# Patient Record
Sex: Male | Born: 1950 | Race: White | Hispanic: No | Marital: Married | Smoking: Never smoker
Health system: Southern US, Community
[De-identification: ages and names within clinical notes are randomized; demographics above are authoritative.]

---

## 2018-02-21 ENCOUNTER — Ambulatory Visit (INDEPENDENT_AMBULATORY_CARE_PROVIDER_SITE_OTHER): Payer: Medicare Other | Admitting: Podiatry

## 2018-02-21 ENCOUNTER — Encounter: Payer: Self-pay | Admitting: Podiatry

## 2018-02-21 ENCOUNTER — Ambulatory Visit (INDEPENDENT_AMBULATORY_CARE_PROVIDER_SITE_OTHER): Payer: Federal, State, Local not specified - PPO

## 2018-02-21 DIAGNOSIS — M7741 Metatarsalgia, right foot: Secondary | ICD-10-CM | POA: Diagnosis not present

## 2018-02-21 DIAGNOSIS — M79672 Pain in left foot: Secondary | ICD-10-CM

## 2018-02-21 DIAGNOSIS — M79671 Pain in right foot: Secondary | ICD-10-CM

## 2018-02-21 DIAGNOSIS — D3610 Benign neoplasm of peripheral nerves and autonomic nervous system, unspecified: Secondary | ICD-10-CM

## 2018-02-21 DIAGNOSIS — M7742 Metatarsalgia, left foot: Secondary | ICD-10-CM

## 2018-02-21 DIAGNOSIS — M778 Other enthesopathies, not elsewhere classified: Secondary | ICD-10-CM

## 2018-02-21 DIAGNOSIS — M216X2 Other acquired deformities of left foot: Secondary | ICD-10-CM

## 2018-02-21 DIAGNOSIS — R52 Pain, unspecified: Secondary | ICD-10-CM

## 2018-02-21 DIAGNOSIS — M216X1 Other acquired deformities of right foot: Secondary | ICD-10-CM | POA: Diagnosis not present

## 2018-02-21 DIAGNOSIS — M779 Enthesopathy, unspecified: Secondary | ICD-10-CM

## 2018-02-21 DIAGNOSIS — M7751 Other enthesopathy of right foot: Secondary | ICD-10-CM | POA: Diagnosis not present

## 2018-02-21 MED ORDER — MELOXICAM 15 MG PO TABS: 15.0000 mg | ORAL_TABLET | Freq: Every day | ORAL | 0 refills | Status: DC

## 2018-02-21 NOTE — Progress Notes (Signed)
This patient presents to the office with chief complaint of severe pain noted in the balls of both feet.  He says this is been going on for 3-4 years and by noon  he has significant pain which causes him to limp for the rest of the day. He points to the forefoot area as the site of his pain  .He denies any redness, swelling in both forefeet.  He has provided no self treatment nor sought any professional help.  He presents the office today for an evaluation and treatment.  General Appearance  Alert, conversant and in no acute stress.  Vascular  Dorsalis pedis and posterior tibial  pulses are palpable  bilaterally.  Capillary return is within normal limits  bilaterally. Temperature is within normal limits  bilaterally.  Neurologic  Senn-Weinstein monofilament wire test within normal limits  bilaterally. Muscle power within normal limits bilaterally.  Nails Normal nails noted with no evidence of bacterial or fungal infection.  Orthopedic  No limitations of motion of motion feet .  No crepitus or effusions noted.  Palpable pain noted to the second, third, fourth and fifth metatarsal heads both feet.  Pain noted in the third interspace bilaterally.  Excessive pronation noted upon weight-bearing.  Skin  normotropic skin with no porokeratosis noted bilaterally.  No signs of infections or ulcers noted.    Capsulitis forefeet  B/L  Excessive pronation  IE.  X-rays reveal midfoot arthritis.  No evidence of any bony pathology forefeet bilaterally.  Discussed this condition with this patient.  Told the patient that due to his severe pronation the transfer of his body. His weight through his forefoot is aggravated.  Prescribed power step insoles to be worn.  Prescribed Mobic to be taken by mouth.  RTC 2 weeks.   Gardiner Barefoot DPM

## 2018-03-07 ENCOUNTER — Encounter: Payer: Self-pay | Admitting: Podiatry

## 2018-03-07 ENCOUNTER — Ambulatory Visit (INDEPENDENT_AMBULATORY_CARE_PROVIDER_SITE_OTHER): Payer: Federal, State, Local not specified - PPO | Admitting: Podiatry

## 2018-03-07 DIAGNOSIS — M216X2 Other acquired deformities of left foot: Secondary | ICD-10-CM

## 2018-03-07 DIAGNOSIS — D3610 Benign neoplasm of peripheral nerves and autonomic nervous system, unspecified: Secondary | ICD-10-CM

## 2018-03-07 DIAGNOSIS — M216X1 Other acquired deformities of right foot: Secondary | ICD-10-CM

## 2018-03-07 NOTE — Progress Notes (Signed)
This patient presents the office for continued evaluation and treatment of this painful forefeet bilateral.  Patient states that he has been wearing his power step insoles and taking his Mobic by mouth.  He says he is 85% improved and is pleased with his progress.  He says there are still pain noted in the bottoms of toes on both feet.  No evidence of redness, swelling or inflammation noted on his feet.  He presents the today for his painful forefeet.  General Appearance  Alert, conversant and in no acute stress.  Vascular  Dorsalis pedis and posterior tibial  pulses are palpable  bilaterally.  Capillary return is within normal limits  bilaterally. Temperature is within normal limits  bilaterally.  Neurologic  Senn-Weinstein monofilament wire test within normal limits  bilaterally. Muscle power within normal limits bilaterally.  Nails Thick disfigured discolored nails with subungual debris  from hallux to fifth toes bilaterally. No evidence of bacterial infection or drainage bilaterally.  Orthopedic  No limitations of motion of motion feet .  No crepitus or effusions noted.  No bony pathology or digital deformities noted. Persistent palpable pain in the second and third interspace bilaterally.  Excessive pronation bilaterally.  Skin  normotropic skin with no porokeratosis noted bilaterally.  No signs of infections or ulcers noted.    Neuroma  B/L  Pronation  B/L  ROV.  Patient was told to continue taking Mobic and using his power step insoles.  He was told we can obtain  a permanent ortthoses for him as needed.  Take Mobic when necessary.  RTC prn.  Gardiner Barefoot DPM

## 2018-03-16 ENCOUNTER — Other Ambulatory Visit: Payer: Self-pay | Admitting: Podiatry

## 2018-03-16 DIAGNOSIS — D3610 Benign neoplasm of peripheral nerves and autonomic nervous system, unspecified: Secondary | ICD-10-CM

## 2021-08-18 ENCOUNTER — Ambulatory Visit
Admission: EM | Admit: 2021-08-18 | Discharge: 2021-08-18 | Disposition: A | Discharge status: Home / Self Care | Payer: Federal, State, Local not specified - PPO | Attending: Urgent Care | Admitting: Urgent Care

## 2021-08-18 ENCOUNTER — Telehealth: Payer: Self-pay

## 2021-08-18 ENCOUNTER — Other Ambulatory Visit: Payer: Self-pay

## 2021-08-18 DIAGNOSIS — M25512 Pain in left shoulder: Secondary | ICD-10-CM

## 2021-08-18 MED ORDER — MELOXICAM 7.5 MG PO TABS: 7.5000 mg | ORAL_TABLET | Freq: Every day | ORAL | 0 refills | Status: DC

## 2021-08-18 MED ORDER — TIZANIDINE HCL 4 MG PO TABS: 4.0000 mg | ORAL_TABLET | Freq: Every day | ORAL | 0 refills | Status: DC

## 2021-08-18 NOTE — ED Provider Notes (Signed)
[place]   MRN: [id number] DOB: 04-20-1951  Subjective:   Jason Espinoza is a 70 y.o. male presenting for 54-month history of persistent intermittent left shoulder pain.  He has also had decreased range of motion.  Reports that he actually slipped and fell 2 months ago and hurt his left shoulder then.  [place] again 2 weeks ago and this time his shoulder hurt much worse.  He did land on his elbow but has noticed that he has had some bruising coming from the shoulder down into the arm.  No particular swelling or pain at the shoulder.  He has a very difficult time at night when he lays down.  No current facility-administered medications for this encounter.  Current Outpatient Medications:    meloxicam (MOBIC) 15 MG tablet, Take 1 tablet (15 mg total) by mouth daily., Disp: 30 tablet, Rfl: 0   No Known Allergies  History reviewed. No pertinent past medical history.   History reviewed. No pertinent surgical history.  Family History  Problem Relation Age of Onset   Healthy Mother    Diabetes Father    Hypertension Father     Social History   Tobacco Use   Smoking status: Never   Smokeless tobacco: Never  Substance Use Topics   Alcohol use: Not Currently   Drug use: Never    ROS   Objective:   Vitals: BP 130/76    Pulse (!) 59    Temp 98 F (36.7 C)    Resp 19    SpO2 96%   Physical Exam Constitutional:      General: He is not in acute distress.    Appearance: Normal appearance. He is well-developed and normal weight. He is not ill-appearing, toxic-appearing or diaphoretic.  HENT:     Head: Normocephalic and atraumatic.     Right Ear: External ear normal.     Left Ear: External ear normal.     Nose: Nose normal.     Mouth/Throat:     Pharynx: Oropharynx is clear.  Eyes:     General: No scleral icterus.       Right eye: No discharge.        Left eye: No discharge.     Extraocular Movements: Extraocular movements intact.     Pupils: Pupils are  equal, round, and reactive to light.  Cardiovascular:     Rate and Rhythm: Normal rate.  Pulmonary:     Effort: Pulmonary effort is normal.  Musculoskeletal:     Left shoulder: No swelling, deformity, effusion, laceration, tenderness, bony tenderness or crepitus. Decreased range of motion (Abduction above 90 degrees, flexion at 90 degrees; however, there is full passive range of motion). Normal strength.       Arms:     Cervical back: Normal range of motion.  Neurological:     Mental Status: He is alert and oriented to person, place, and time.  Psychiatric:        Mood and Affect: Mood normal.        Behavior: Behavior normal.        Thought Content: Thought content normal.        Judgment: Judgment normal.    Assessment and Plan :   PDMP not reviewed this encounter.  1. Acute pain of left shoulder    Possible biceps tendon injury, rotator cuff injury, labral injury.  Physical exam findings not consistent with fracture, dislocation and therefore will defer imaging.  Recommended establishing care and following up  with an orthopedist to pursue imaging such as an MRI or ultrasound to evaluate for soft tissue injury.  In the meantime counseled on shoulder rehab exercises, recommended meloxicam once daily with tizanidine as needed. Counseled patient on potential for adverse effects with medications prescribed/recommended today, ER and return-to-clinic precautions discussed, patient verbalized understanding.    Jaynee Eagles, PA-C 08/18/21 1423

## 2021-08-18 NOTE — ED Triage Notes (Signed)
Pt presents with complaints of left shoulder pain. Reports tripping and falling this past week and landing on his left elbow. Reports 2 months ago he slipped on a piece of plastic and tried to grab something to keep him from falling hurting his left shoulder. Pt has had ongoing issues with shoulder since initial injury.

## 2021-08-18 NOTE — Telephone Encounter (Signed)
Spoke with pt and told to contact the pharmacy again, as prescription was resend.

## 2021-08-19 ENCOUNTER — Ambulatory Visit: Payer: Self-pay

## 2021-08-19 ENCOUNTER — Ambulatory Visit (HOSPITAL_BASED_OUTPATIENT_CLINIC_OR_DEPARTMENT_OTHER)
Admission: RE | Admit: 2021-08-19 | Discharge: 2021-08-19 | Disposition: A | Discharge status: Home / Self Care | Payer: Federal, State, Local not specified - PPO | Source: Ambulatory Visit | Attending: Family Medicine | Admitting: Family Medicine

## 2021-08-19 ENCOUNTER — Ambulatory Visit (INDEPENDENT_AMBULATORY_CARE_PROVIDER_SITE_OTHER): Payer: Federal, State, Local not specified - PPO | Admitting: Family Medicine

## 2021-08-19 ENCOUNTER — Encounter: Payer: Self-pay | Admitting: Family Medicine

## 2021-08-19 VITALS — BP 148/90 | Ht 72.0 in | Wt 185.0 lb

## 2021-08-19 DIAGNOSIS — S46812A Strain of other muscles, fascia and tendons at shoulder and upper arm level, left arm, initial encounter: Secondary | ICD-10-CM | POA: Insufficient documentation

## 2021-08-19 DIAGNOSIS — M25512 Pain in left shoulder: Secondary | ICD-10-CM

## 2021-08-19 NOTE — Progress Notes (Signed)
°  Jason Espinoza - 70 y.o. male MRN [id number]  Date of birth: [birth year]  SUBJECTIVE:  Including CC & ROS.  No chief complaint on file.   Jason Espinoza is a 69 y.o. male that is presenting with acute left shoulder pain.  The pain is ongoing for the past few weeks.  He had a injury where it forced his arm into abduction as well as external rotation.  Have significant bruising in the proximal portion of his upper arm.  Unable to lift and abduction against gravity.    Review of Systems See HPI   HISTORY: Past Medical, Surgical, Social, and Family History Reviewed & Updated per EMR.   Pertinent Historical Findings include:  History reviewed. No pertinent past medical history.  History reviewed. No pertinent surgical history.  Family History  Problem Relation Age of Onset   Healthy Mother    Diabetes Father    Hypertension Father     Social History   Socioeconomic History   Marital status: Married    Spouse name: Not on file   Number of children: Not on file   Years of education: Not on file   Highest education level: Not on file  Occupational History   Not on file  Tobacco Use   Smoking status: Never   Smokeless tobacco: Never  Substance and Sexual Activity   Alcohol use: Not Currently   Drug use: Never   Sexual activity: Not on file  Other Topics Concern   Not on file  Social History Narrative   Not on file   Social Determinants of Health   Financial Resource Strain: Not on file  Food Insecurity: Not on file  Transportation Needs: Not on file  Physical Activity: Not on file  Stress: Not on file  Social Connections: Not on file  Intimate Partner Violence: Not on file     PHYSICAL EXAM:  VS: BP (!) 148/90 (BP Location: Left Arm, Patient Position: Sitting)    Ht 6' (1.829 m)    Wt 185 lb (83.9 kg)    BMI 25.09 kg/m  Physical Exam Gen: NAD, alert, cooperative with exam, well-appearing   Limited ultrasound: Left shoulder:  Encircling effusion of the biceps  tendon. Partial complete tear of the subscapularis. Partial complete tear of the supraspinatus with overlying bursitis. Effusion appreciated within the posterior glenohumeral joint.  Summary: Findings indicate partial complete tears of the subscapularis and supraspinatus  Ultrasound and interpretation by Clearance Coots, MD     ASSESSMENT & PLAN:   Traumatic tear of supraspinatus tendon of left shoulder Injury occurred a few weeks ago.  Has ecchymosis on the upper extremity.  Has weakness with abduction and gravity. -Counseled on home exercise therapy and supportive care. -Counseled on sling. -X-ray. -MRI of the left shoulder to evaluate the extent of the rotator cuff tear and presurgical management..  Full thickness tear of left subscapularis tendon Injury occurred a few weeks ago.  Has weakness with external rotation. -Counseled on home exercise therapy and supportive care. -X-ray. -MRI of the left shoulder to evaluate for rotator cuff tear and presurgical management.

## 2021-08-19 NOTE — Assessment & Plan Note (Addendum)
Injury occurred a few weeks ago.  Has ecchymosis on the upper extremity.  Has weakness with abduction and gravity. -Counseled on home exercise therapy and supportive care. -Counseled on sling. -X-ray. -MRI of the left shoulder to evaluate the extent of the rotator cuff tear and presurgical management.Marland Kitchen

## 2021-08-19 NOTE — Patient Instructions (Signed)
Nice to meet you  Please use ice as needed  Please use the sling for comfort  Please call [phone] to schedule the MRI  Please send me a message in MyChart with any questions or updates.  We'll schedule a virtual visit once the MRI is resulted.   --Dr. Raeford Razor

## 2021-08-19 NOTE — Assessment & Plan Note (Signed)
Injury occurred a few weeks ago.  Has weakness with external rotation. -Counseled on home exercise therapy and supportive care. -X-ray. -MRI of the left shoulder to evaluate for rotator cuff tear and presurgical management.

## 2021-08-21 ENCOUNTER — Telehealth: Payer: Self-pay | Admitting: Family Medicine

## 2021-08-21 NOTE — Telephone Encounter (Signed)
Pt informed of below.  

## 2021-08-21 NOTE — Telephone Encounter (Signed)
Left VM for patient. If he calls back please have him speak with a nurse/CMA and inform that his xray is normal. We'll continue with the mri.   If any questions then please take the best time and phone number to call and I will try to call him back.   Rosemarie Ax, MD [facility] 08/21/2021, 9:40 AM

## 2021-09-09 ENCOUNTER — Telehealth: Payer: Self-pay | Admitting: Family Medicine

## 2021-09-09 NOTE — Telephone Encounter (Signed)
Pt called states MRI not till 09/18/21 @ GI, he about to run out of Rx given by ED provider & ask if Dr.Schmitz would approve refills on :   meloxicam (MOBIC) 7.5 MG tablet [[id number]]    Order Details Dose: 7.5 mg Route: Oral Frequency: Daily  Dispense Quantity: 30 tablet Refills: 0        Sig: Take 1 tablet (7.5 mg total) by mouth daily.       &   tiZANidine (ZANAFLEX) 4 MG tablet [[id number]]    Order Details Dose: 4 mg Route: Oral Frequency: Daily at bedtime  Dispense Quantity: 30 tablet Refills: 0        Sig: Take 1 tablet (4 mg total) by mouth at bedtime    --Pt uses :   [id number] - [place], [place] - [address] [facility] Phone:  [phone]  Fax:  [phone]    --Dion Body

## 2021-09-10 MED ORDER — MELOXICAM 7.5 MG PO TABS: 7.5000 mg | ORAL_TABLET | Freq: Every day | ORAL | 0 refills | Status: DC

## 2021-09-10 MED ORDER — TIZANIDINE HCL 4 MG PO TABS: 4.0000 mg | ORAL_TABLET | Freq: Every day | ORAL | 0 refills | Status: DC

## 2021-09-18 ENCOUNTER — Other Ambulatory Visit: Payer: Self-pay

## 2021-09-18 ENCOUNTER — Ambulatory Visit
Admission: RE | Admit: 2021-09-18 | Discharge: 2021-09-18 | Disposition: A | Discharge status: Home / Self Care | Payer: Federal, State, Local not specified - PPO | Source: Ambulatory Visit | Attending: Family Medicine | Admitting: Family Medicine

## 2021-09-18 DIAGNOSIS — S46812A Strain of other muscles, fascia and tendons at shoulder and upper arm level, left arm, initial encounter: Secondary | ICD-10-CM

## 2021-09-19 ENCOUNTER — Encounter: Payer: Self-pay | Admitting: Family Medicine

## 2021-09-19 ENCOUNTER — Telehealth (INDEPENDENT_AMBULATORY_CARE_PROVIDER_SITE_OTHER): Payer: Federal, State, Local not specified - PPO | Admitting: Family Medicine

## 2021-09-19 VITALS — Ht 72.0 in | Wt 185.0 lb

## 2021-09-19 DIAGNOSIS — S46812D Strain of other muscles, fascia and tendons at shoulder and upper arm level, left arm, subsequent encounter: Secondary | ICD-10-CM | POA: Diagnosis not present

## 2021-09-19 DIAGNOSIS — M19012 Primary osteoarthritis, left shoulder: Secondary | ICD-10-CM | POA: Diagnosis not present

## 2021-09-19 DIAGNOSIS — M19019 Primary osteoarthritis, unspecified shoulder: Secondary | ICD-10-CM | POA: Insufficient documentation

## 2021-09-19 MED ORDER — NITROGLYCERIN 0.2 MG/HR TD PT24: MEDICATED_PATCH | TRANSDERMAL | 11 refills | Status: DC

## 2021-09-19 NOTE — Assessment & Plan Note (Signed)
MRI was confirming the tear that shows retraction of 2.5 cm.He would like to stay away from surgery is much as possible. -Counseled on home exercise therapy and supportive care. -Referral to physical therapy. -Could consider injection.

## 2021-09-19 NOTE — Assessment & Plan Note (Signed)
MRI was confirming the tear of the supraspinatus with mild retraction.  It does occur at the interval. -Counseled at home exercise therapy and supportive care. -Referral to physical therapy. -Nitro patches. -Shockwave therapy.

## 2021-09-19 NOTE — Progress Notes (Signed)
Virtual Visit via Telephone Note  I connected with Jason Espinoza on 09/19/21 at 10:50 AM EST by telephone and verified that I am speaking with the correct person using two identifiers.  Location: Patient: home Provider: office   I discussed the limitations, risks, security and privacy concerns of performing an evaluation and management service by telephone and the availability of in person appointments. I also discussed with the patient that there may be a patient responsible charge related to this service. The patient expressed understanding and agreed to proceed.   History of Present Illness:  Jason Espinoza is a 71 yo M that is following up for the [facility] of his left shoulder.  This was revealing for a small retracted tear of the anterior aspect of the supraspinatus that is 10 mm in retraction and 14 mm wide.  There is a full-thickness retracted tear of the subscapularis at 2.5 cm.  The glenohumeral joint is showing moderate degenerative changes with effusion.   Observations/Objective:   Assessment and Plan:  Full-thickness tear of the left subscapularis tendon: MRI was confirming the tear that shows retraction of 2.5 cm.He would like to stay away from surgery is much as possible. -Counseled on home exercise therapy and supportive care. -Referral to physical therapy. -Could consider injection.  Traumatic tear of the supraspinatus tendon of the left shoulder: MRI was confirming the tear of the supraspinatus with mild retraction.  It does occur at the interval. -Counseled at home exercise therapy and supportive care. -Referral to physical therapy. -Nitro patches. -Shockwave therapy.  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  He has 2 tears of the rotator cuff currently.  He wants to avoid surgery as much as possible.  Counseled on the possibility of shoulder replacement.  We will pursue conservative measures at this time.  Follow Up  Instructions:    I discussed the assessment and treatment plan with the patient. The patient was provided an opportunity to ask questions and all were answered. The patient agreed with the plan and demonstrated an understanding of the instructions.   The patient was advised to call back or seek an in-person evaluation if the symptoms worsen or if the condition fails to improve as anticipated.  I provided 8 minutes of non-face-to-face time during this encounter.   Clearance Coots, MD

## 2021-09-19 NOTE — Assessment & Plan Note (Signed)
MRI was revealing for degenerative changes with an effusion.  He has 2 tears of the rotator cuff currently.  He wants to avoid surgery as much as possible.  Counseled on the possibility of shoulder replacement.  We will pursue conservative measures at this time.

## 2021-09-22 ENCOUNTER — Telehealth: Payer: Self-pay | Admitting: Family Medicine

## 2021-09-22 NOTE — Telephone Encounter (Signed)
[facility] rep cld to say Shoulder therapy Order needs to be changed to Occupational Therapy they don't do  shoulder rehab under  Physical therapy.  --Forwarding request to provider for changes.  --glh

## 2021-09-22 NOTE — Addendum Note (Signed)
Addended by: Cyd Silence on: 09/22/2021 10:51 AM   Modules accepted: Orders

## 2021-09-23 ENCOUNTER — Telehealth: Payer: Self-pay | Admitting: Family Medicine

## 2021-09-23 NOTE — Telephone Encounter (Signed)
Patient informed to place his nitro patch on the painful area of his left shoulder.

## 2021-09-23 NOTE — Telephone Encounter (Signed)
Pt called wishes someone to tell gim where to place the  Nitroglycerin patch on his body-- ---Forwarding message to med asst--[phone]

## 2021-09-24 ENCOUNTER — Ambulatory Visit (INDEPENDENT_AMBULATORY_CARE_PROVIDER_SITE_OTHER): Payer: Federal, State, Local not specified - PPO | Admitting: Family Medicine

## 2021-09-24 ENCOUNTER — Encounter: Payer: Self-pay | Admitting: Family Medicine

## 2021-09-24 DIAGNOSIS — S46812D Strain of other muscles, fascia and tendons at shoulder and upper arm level, left arm, subsequent encounter: Secondary | ICD-10-CM

## 2021-09-24 NOTE — Assessment & Plan Note (Signed)
Completed shockwave therapy  

## 2021-09-24 NOTE — Progress Notes (Signed)
°  Jason Espinoza - 71 y.o. male MRN [id number]  Date of birth: 27-Feb-1951  SUBJECTIVE:  Including CC & ROS.  No chief complaint on file.   Jason Espinoza is a 71 y.o. male that is here for shockwave therapy.    Review of Systems See HPI   HISTORY: Past Medical, Surgical, Social, and Family History Reviewed & Updated per EMR.   Pertinent Historical Findings include:  History reviewed. No pertinent past medical history.  History reviewed. No pertinent surgical history.   PHYSICAL EXAM:  VS: Ht 6' (1.829 m)    Wt 185 lb (83.9 kg)    BMI 25.09 kg/m  Physical Exam Gen: NAD, alert, cooperative with exam, well-appearing MSK:  Neurovascularly intact    ECSWT Note Jason Espinoza [birth year]  Procedure: ECSWT Indications: left shoulder pain   Procedure Details Consent: Risks of procedure as well as the alternatives and risks of each were explained to the (patient/caregiver).  Consent for procedure obtained. Time Out: Verified patient identification, verified procedure, site/side was marked, verified correct patient position, special equipment/implants available, medications/allergies/relevent history reviewed, required imaging and test results available.  Performed.  The area was cleaned with iodine and alcohol swabs.    The left shoulder was targeted for Extracorporeal shockwave therapy.   Preset: shoulder problems Power Level: 80 Frequency: 10 Impulse/cycles: 3200 Head size: medium  Session: 1st  Patient did tolerate procedure well.    ASSESSMENT & PLAN:   Traumatic tear of supraspinatus tendon of left shoulder Completed shockwave therapy

## 2021-09-24 NOTE — Patient Instructions (Signed)
Good to see you  Please send me a message in MyChart with any questions or updates.  Please see me back in 1 week.   --Dr. Raeford Razor  Nitroglycerin Protocol  Apply 1/4 nitroglycerin patch to affected area daily. Change position of patch within the affected area every 24 hours. You may experience a headache during the first 1-2 weeks of using the patch, these should subside. If you experience headaches after beginning nitroglycerin patch treatment, you may take your preferred over the counter pain reliever. Another side effect of the nitroglycerin patch is skin irritation or rash related to patch adhesive. Please notify our office if you develop more severe headaches or rash, and stop the patch. Tendon healing with nitroglycerin patch may require 12 to 24 weeks depending on the extent of injury. Men should not use if taking Viagra, Cialis, or Levitra.  Do not use if you have migraines or rosacea.

## 2021-10-01 ENCOUNTER — Encounter: Payer: Self-pay | Admitting: Family Medicine

## 2021-10-01 ENCOUNTER — Ambulatory Visit (INDEPENDENT_AMBULATORY_CARE_PROVIDER_SITE_OTHER): Payer: Self-pay | Admitting: Family Medicine

## 2021-10-01 DIAGNOSIS — S46812D Strain of other muscles, fascia and tendons at shoulder and upper arm level, left arm, subsequent encounter: Secondary | ICD-10-CM

## 2021-10-01 NOTE — Assessment & Plan Note (Signed)
Completed shockwave therapy today.  

## 2021-10-01 NOTE — Progress Notes (Signed)
°  Jason Espinoza - 71 y.o. male MRN [id number]  Date of birth: 1950-10-19  SUBJECTIVE:  Including CC & ROS.  No chief complaint on file.   Jason Espinoza is a 71 y.o. male that is presenting for shockwave therapy.    Review of Systems See HPI   HISTORY: Past Medical, Surgical, Social, and Family History Reviewed & Updated per EMR.   Pertinent Historical Findings include:  History reviewed. No pertinent past medical history.  History reviewed. No pertinent surgical history.   PHYSICAL EXAM:  VS: Ht 6' (1.829 m)    Wt 185 lb (83.9 kg)    BMI 25.09 kg/m  Physical Exam Gen: NAD, alert, cooperative with exam, well-appearing MSK:  Neurovascularly intact    ECSWT Note Quavon Keisling 1950/09/10  Procedure: ECSWT Indications: left shoulder pain   Procedure Details Consent: Risks of procedure as well as the alternatives and risks of each were explained to the (patient/caregiver).  Consent for procedure obtained. Time Out: Verified patient identification, verified procedure, site/side was marked, verified correct patient position, special equipment/implants available, medications/allergies/relevent history reviewed, required imaging and test results available.  Performed.  The area was cleaned with iodine and alcohol swabs.    The left shoulder was targeted for Extracorporeal shockwave therapy.   Preset: Shoulder problems Power Level: 80 Frequency: 10 Impulse/cycles: 3500 Head size: Medium Session: 2nd  Patient did tolerate procedure well.    ASSESSMENT & PLAN:   Traumatic tear of supraspinatus tendon of left shoulder Completed shockwave therapy today.

## 2021-10-02 ENCOUNTER — Other Ambulatory Visit: Payer: Self-pay

## 2021-10-02 ENCOUNTER — Encounter (HOSPITAL_COMMUNITY): Payer: Self-pay | Admitting: Occupational Therapy

## 2021-10-02 ENCOUNTER — Ambulatory Visit (HOSPITAL_COMMUNITY): Payer: Federal, State, Local not specified - PPO | Attending: Family Medicine | Admitting: Occupational Therapy

## 2021-10-02 DIAGNOSIS — R29898 Other symptoms and signs involving the musculoskeletal system: Secondary | ICD-10-CM | POA: Diagnosis present

## 2021-10-02 DIAGNOSIS — M25512 Pain in left shoulder: Secondary | ICD-10-CM | POA: Diagnosis not present

## 2021-10-02 NOTE — Therapy (Signed)
[place], [place], [address] Phone: [phone]   Fax:  [phone]  Occupational Therapy Evaluation  Patient Details  Name: Jason Espinoza MRN: [id number] Date of Birth: 1951/01/21 Referring Provider (OT): Dr. Clearance Coots   Encounter Date: 10/02/2021   OT End of Session - 10/02/21 1329     Visit Number 1    Number of Visits 1    Date for OT Re-Evaluation 10/03/21    Authorization Type [employer], $25 copay    Authorization Time Period 25 visit limit combined PT/OT/ST    Authorization - Visit Number 1    Authorization - Number of Visits 25    OT Start Time 7048    OT Stop Time 1317    OT Time Calculation (min) 32 min    Activity Tolerance Patient tolerated treatment well    Behavior During Therapy [facility] for tasks assessed/performed             History reviewed. No pertinent past medical history.  History reviewed. No pertinent surgical history.  There were no vitals filed for this visit.   Subjective Assessment - 10/02/21 1241     Subjective  S: I fell on it twice and injured it.    Pertinent History Pt is a 71 y/o male presenting with left shoulder pain secondary to full thickness subscapularis tear and a supraspinatus tear. Pt had a fall in November and a fall in December landing on the left shoulder each time. Pt was referred to occupational therapy for evaluation and treatment by Dr. Clearance Coots.    Special Tests FOTO: 68/100    Patient Stated Goals To have more use of my arm.    Currently in Pain? No/denies               [facility] OT Assessment - 10/02/21 1241       Assessment   Medical Diagnosis left RTC tear of supraspinatus and labrum    Referring Provider (OT) Dr. Clearance Coots    Onset Date/Surgical Date 07/01/21    Hand Dominance Right    Next MD Visit 10/08/2021    Prior Therapy Shockwave therapy on 09/24/21 & 10/01/21      Precautions   Precautions None      Restrictions   Weight  Bearing Restrictions No      Balance Screen   Has the patient fallen in the past 6 months Yes    How many times? 2    Has the patient had a decrease in activity level because of a fear of falling?  No    Is the patient reluctant to leave their home because of a fear of falling?  No      Prior Function   Level of Independence Independent    Vocation Retired    Leisure woodworking      ADL   ADL comments Pt is having difficulty with dressing, reaching overhead and behind back. Pt has difficulty with lifting items-lifting a flake of hay is difficult. Pt is sleeping on couch/recliner due to pain in the shoulder blade with pressure.      Written Expression   Dominant Hand Right      Cognition   Overall Cognitive Status Within Functional Limits for tasks assessed      Observation/Other Assessments   Focus on Therapeutic Outcomes (FOTO)  63/100      ROM / Strength   AROM / PROM / Strength AROM;PROM;Strength  Palpation   Palpation comment mod fascial restrictions in trapezius and teres regions.      AROM   Overall AROM Comments Assessed seated, er/IR adducted    AROM Assessment Site Shoulder    Right/Left Shoulder Left    Left Shoulder Flexion 157 Degrees    Left Shoulder ABduction 155 Degrees    Left Shoulder Internal Rotation 90 Degrees    Left Shoulder External Rotation 57 Degrees      PROM   Overall PROM  Within functional limits for tasks performed                  Strength   Overall Strength Comments Assessed seated, er/IR adducted    Strength Assessment Site Shoulder    Right/Left Shoulder Left    Left Shoulder Flexion 5/5    Left Shoulder ABduction 4/5    Left Shoulder Internal Rotation 5/5    Left Shoulder External Rotation 4/5                              OT Education - 10/02/21 1305     Education Details green theraband strengthening    Person(s) Educated Patient    Methods Explanation;Demonstration;Handout    Comprehension  Verbalized understanding;Returned demonstration              OT Short Term Goals - 10/02/21 1333       OT SHORT TERM GOAL #1   Title Pt will be provided with and educated on HEP for LUE strengthening to mobility and strength required for ADL and woodworking tasks.    Time 1    Period Days    Status Achieved    Target Date 10/02/21                      Plan - 10/02/21 1329     Clinical Impression Statement A: Pt is a 71 y/o male presenting with left subscapularis tear, supraspinatus tear secondary to two recent falls. Pt is undergoing shockwave therapy for pain managment and reports he does not have much pain unless he moves the arm in a certain way or lifts something too heavy. Pt demonstrates ROM WFL (equivalent to RUE) and good strength today, abduction is more painful and weaker than other motions. Discussed findings and expected functioning considering he will have these 2 tears unless surgically repaired. Pt is agreeable to HEP for strengthening to maintain current functioning and build strength to surrounding structure to assist with compensatory mobility.    OT Occupational Profile and History Problem Focused Assessment - Including review of records relating to presenting problem    Occupational performance deficits (Please refer to evaluation for details): ADL's;IADL's;Rest and Sleep;Leisure    Body Structure / Function / Physical Skills ADL;Endurance;Muscle spasms;UE functional use;Fascial restriction;Pain;ROM;IADL;Strength    Rehab Potential Good    Clinical Decision Making Limited treatment options, no task modification necessary    Comorbidities Affecting Occupational Performance: None    Modification or Assistance to Complete Evaluation  No modification of tasks or assist necessary to complete eval    OT Frequency One time visit    OT Treatment/Interventions Patient/family education    Plan P: Pt provided with and educated on HEP for LUE strengthening. No  further OT services required at this time, pt demonstrates good form and technique with tasks. All questions addressed.    OT Home Exercise Plan eval: green theraband strengthening  Consulted and Agree with Plan of Care Patient             Patient will benefit from skilled therapeutic intervention in order to improve the following deficits and impairments:   Body Structure / Function / Physical Skills: ADL, Endurance, Muscle spasms, UE functional use, Fascial restriction, Pain, ROM, IADL, Strength       Visit Diagnosis: Acute pain of left shoulder  Other symptoms and signs involving the musculoskeletal system    Problem List Patient Active Problem List   Diagnosis Date Noted   OA (osteoarthritis) of shoulder 09/19/2021   Traumatic tear of supraspinatus tendon of left shoulder 08/19/2021   Full thickness tear of left subscapularis tendon 08/19/2021    Guadelupe Sabin, OTR/L  [phone] 10/02/2021, 1:34 PM  [place], [place], [address] Phone: [phone]   Fax:  [phone]  Name: Jason Espinoza MRN: [id number] Date of Birth: 1951-04-12

## 2021-10-02 NOTE — Patient Instructions (Signed)

## 2021-10-08 ENCOUNTER — Encounter (INDEPENDENT_AMBULATORY_CARE_PROVIDER_SITE_OTHER): Payer: Self-pay | Admitting: *Deleted

## 2021-10-08 ENCOUNTER — Ambulatory Visit (INDEPENDENT_AMBULATORY_CARE_PROVIDER_SITE_OTHER): Payer: Federal, State, Local not specified - PPO | Admitting: Family Medicine

## 2021-10-08 ENCOUNTER — Encounter: Payer: Self-pay | Admitting: Family Medicine

## 2021-10-08 VITALS — Ht 72.0 in | Wt 185.0 lb

## 2021-10-08 DIAGNOSIS — S46812D Strain of other muscles, fascia and tendons at shoulder and upper arm level, left arm, subsequent encounter: Secondary | ICD-10-CM

## 2021-10-08 NOTE — Assessment & Plan Note (Signed)
Referral to orthopedics 

## 2021-10-08 NOTE — Progress Notes (Signed)
°  Jason Espinoza - 71 y.o. male MRN [id number]  Date of birth: [birth year]  SUBJECTIVE:  Including CC & ROS.  No chief complaint on file.   Jason Espinoza is a 71 y.o. male that is here for shockwave therapy.   Review of Systems See HPI   HISTORY: Past Medical, Surgical, Social, and Family History Reviewed & Updated per EMR.   Pertinent Historical Findings include:  History reviewed. No pertinent past medical history.  History reviewed. No pertinent surgical history.   PHYSICAL EXAM:  VS: Ht 6' (1.829 m)    Wt 185 lb (83.9 kg)    BMI 25.09 kg/m  Physical Exam Gen: NAD, alert, cooperative with exam, well-appearing MSK:  Neurovascularly intact    ECSWT Note Jason Espinoza 1951/05/21  Procedure: ECSWT Indications: left shoulder pain  Procedure Details Consent: Risks of procedure as well as the alternatives and risks of each were explained to the (patient/caregiver).  Consent for procedure obtained. Time Out: Verified patient identification, verified procedure, site/side was marked, verified correct patient position, special equipment/implants available, medications/allergies/relevent history reviewed, required imaging and test results available.  Performed.  The area was cleaned with iodine and alcohol swabs.    The left shoulder was targeted for Extracorporeal shockwave therapy.   Preset: shoulder problems Power Level: 90 Frequency: 10 Impulse/cycles: 3200 Head size: Large Session: 3rd  Patient did tolerate procedure well.    ASSESSMENT & PLAN:   Traumatic tear of supraspinatus tendon of left shoulder Completed shockwave therapy.  Full thickness tear of left subscapularis tendon Referral to orthopedics

## 2021-10-08 NOTE — Assessment & Plan Note (Signed)
Completed shockwave therapy  

## 2021-10-13 ENCOUNTER — Other Ambulatory Visit: Payer: Self-pay | Admitting: Family Medicine

## 2021-10-14 ENCOUNTER — Other Ambulatory Visit: Payer: Self-pay | Admitting: Family Medicine

## 2021-10-15 ENCOUNTER — Ambulatory Visit (INDEPENDENT_AMBULATORY_CARE_PROVIDER_SITE_OTHER): Payer: Federal, State, Local not specified - PPO | Admitting: Family Medicine

## 2021-10-15 ENCOUNTER — Encounter: Payer: Self-pay | Admitting: Family Medicine

## 2021-10-15 DIAGNOSIS — S46812D Strain of other muscles, fascia and tendons at shoulder and upper arm level, left arm, subsequent encounter: Secondary | ICD-10-CM

## 2021-10-15 NOTE — Progress Notes (Addendum)
°  Jason Espinoza - 71 y.o. male MRN [id number]  Date of birth: 1950-09-03  SUBJECTIVE:  Including CC & ROS.  No chief complaint on file.   Jason Espinoza is a 71 y.o. male that is  here for shockwave therapy.   Review of Systems See HPI   HISTORY: Past Medical, Surgical, Social, and Family History Reviewed & Updated per EMR.   Pertinent Historical Findings include:  History reviewed. No pertinent past medical history.  History reviewed. No pertinent surgical history.   PHYSICAL EXAM:  VS: Ht 6' (1.829 m)    Wt 185 lb (83.9 kg)    BMI 25.09 kg/m  Physical Exam Gen: NAD, alert, cooperative with exam, well-appearing MSK:  Neurovascularly intact    ECSWT Note Jason Espinoza 11/07/1950  Procedure: ECSWT Indications: left shoulder pain   Procedure Details Consent: Risks of procedure as well as the alternatives and risks of each were explained to the (patient/caregiver).  Consent for procedure obtained. Time Out: Verified patient identification, verified procedure, site/side was marked, verified correct patient position, special equipment/implants available, medications/allergies/relevent history reviewed, required imaging and test results available.  Performed.  The area was cleaned with iodine and alcohol swabs.    The left shoulder was targeted for Extracorporeal shockwave therapy.   Preset: shoulder problem Power Level: 100 Frequency: 10 Impulse/cycles: 3200 Head size: large   Session: 4th  Patient did tolerate procedure well.    ASSESSMENT & PLAN:   Traumatic tear of supraspinatus tendon of left shoulder Completed shockwave therapy

## 2021-10-15 NOTE — Assessment & Plan Note (Signed)
Completed shockwave therapy  

## 2021-10-22 ENCOUNTER — Ambulatory Visit (INDEPENDENT_AMBULATORY_CARE_PROVIDER_SITE_OTHER): Payer: Federal, State, Local not specified - PPO | Admitting: Family Medicine

## 2021-10-22 ENCOUNTER — Encounter: Payer: Self-pay | Admitting: Family Medicine

## 2021-10-22 DIAGNOSIS — S46812D Strain of other muscles, fascia and tendons at shoulder and upper arm level, left arm, subsequent encounter: Secondary | ICD-10-CM

## 2021-10-22 NOTE — Progress Notes (Signed)
°  Duval Macleod - 71 y.o. male MRN [id number]  Date of birth: 1951/03/10  SUBJECTIVE:  Including CC & ROS.  No chief complaint on file.   Aseel Uhde is a 71 y.o. male that is here for shockwave therapy.    Review of Systems See HPI   HISTORY: Past Medical, Surgical, Social, and Family History Reviewed & Updated per EMR.   Pertinent Historical Findings include:  History reviewed. No pertinent past medical history.  History reviewed. No pertinent surgical history.   PHYSICAL EXAM:  VS: Ht 6' (1.829 m)    Wt 185 lb (83.9 kg)    BMI 25.09 kg/m  Physical Exam Gen: NAD, alert, cooperative with exam, well-appearing MSK:  Neurovascularly intact    ECSWT Note Jerret Mcbane [birth year]  Procedure: ECSWT Indications: Left shoulder pain  Procedure Details Consent: Risks of procedure as well as the alternatives and risks of each were explained to the (patient/caregiver).  Consent for procedure obtained. Time Out: Verified patient identification, verified procedure, site/side was marked, verified correct patient position, special equipment/implants available, medications/allergies/relevent history reviewed, required imaging and test results available.  Performed.  The area was cleaned with iodine and alcohol swabs.    The left shoulder was targeted for Extracorporeal shockwave therapy.   Preset: Shoulder problems Power Level: 110 Frequency: 10 Impulse/cycles: 3200 Head size: Large Session: 5th  Patient did tolerate procedure well.    ASSESSMENT & PLAN:   Traumatic tear of supraspinatus tendon of left shoulder Completed shockwave therapy

## 2021-10-22 NOTE — Assessment & Plan Note (Signed)
Completed shockwave therapy  

## 2021-11-08 ENCOUNTER — Other Ambulatory Visit: Payer: Self-pay | Admitting: Family Medicine

## 2021-11-18 ENCOUNTER — Other Ambulatory Visit: Payer: Self-pay

## 2021-11-18 ENCOUNTER — Encounter (HOSPITAL_COMMUNITY): Payer: Self-pay

## 2021-11-18 ENCOUNTER — Ambulatory Visit (HOSPITAL_COMMUNITY): Payer: Federal, State, Local not specified - PPO | Attending: Surgical

## 2021-11-18 DIAGNOSIS — M25512 Pain in left shoulder: Secondary | ICD-10-CM | POA: Diagnosis present

## 2021-11-18 DIAGNOSIS — R29898 Other symptoms and signs involving the musculoskeletal system: Secondary | ICD-10-CM

## 2021-11-18 DIAGNOSIS — M25612 Stiffness of left shoulder, not elsewhere classified: Secondary | ICD-10-CM | POA: Diagnosis present

## 2021-11-18 NOTE — Therapy (Signed)
?OUTPATIENT OCCUPATIONAL THERAPY ORTHO EVALUATION ? ?Patient Name: Jason Espinoza ?MRN: [id number] ?DOB:Feb 03, 1951, 71 y.o., male ?Today's Date: 11/18/2021 ? ?PCP: Leslie Andrea, MD ?REFERRING PROVIDER: Sheryle Hail, PA-C ; Tania Ade, MD - surgeon  ? ? ? ?History reviewed. No pertinent past medical history. ?History reviewed. No pertinent surgical history. ?Patient Active Problem List  ? Diagnosis Date Noted  ? OA (osteoarthritis) of shoulder 09/19/2021  ? Traumatic tear of supraspinatus tendon of left shoulder 08/19/2021  ? Full thickness tear of left subscapularis tendon 08/19/2021  ? ? ?ONSET DATE: 10/28/21 ? ?REFERRING DIAG: S/P left shoulder arthroscopic RCR with subscapularis repair, SAD, biceps tenotomy ? ?THERAPY DIAG:  ?Acute pain of left shoulder ? ?Other symptoms and signs involving the musculoskeletal system ? ?Stiffness of left shoulder, not elsewhere classified ? ?SUBJECTIVE:  ? ?SUBJECTIVE STATEMENT: ?S: I am able to sleep in the bed.  ?Pt accompanied by: self ? ?PERTINENT HISTORY: Pt is a 71 y/o male S/P left shoulder arthroscopy with subscapularis repair, SAD, and biceps tenotomy completed on 10/28/21. ? ?PRECAUTIONS: Shoulder and Other: see media tab  Sling on for 6 weeks. No er for 6 weeks per MD.  ? ?WEIGHT BEARING RESTRICTIONS Yes LUE NWB ? ?PAIN:  ?Are you having pain?  Nothing at rest.   Will have pain when sleeping after 3-4 hours of sleep. Takes OTC pain medication. 4/10 ? ?FALLS: Has patient fallen in last 6 months? Yes, Number of falls: 2 ? ?LIVING ENVIRONMENT: ?Lives with: lives with their spouse ? ? ?PLOF: Independent ? ?PATIENT GOALS To get left arm back to normal. ? ?OBJECTIVE:  ? ?HAND DOMINANCE: Right ? ?ADLs: ?Overall ADLs: Unable to complete any ADL tasks using his LUE. ?Transfers/ambulation related to ADLs: ? ?FUNCTIONAL OUTCOME MEASURES: ?FOTO: completed next session ? ?UE ROM    ? ?Passive ROM Left ?11/18/2021  ?Shoulder flexion 80  ?Shoulder abduction 85  ?Shoulder  internal rotation 90  ?Shoulder external rotation Not completed due to protocol  ?(Blank rows = not tested) ? ?A/ROM not completed due to protocol. ?Active ROM Left ?11/18/2021  ?Shoulder flexion   ?Shoulder abduction   ?Shoulder internal rotation   ?Shoulder external rotation   ?(Blank rows = not tested) ? ? ? ?UE MMT:    ?Strength not assessed due to protocol. ?MMT Left ?11/18/2021  ?Shoulder flexion   ?Shoulder abduction   ?Shoulder internal rotation   ?Shoulder external rotation   ?(Blank rows = not tested) ? ? ? ?COGNITION: ?Overall cognitive status: Within functional limits for tasks assessed ? ? ?OBSERVATIONS: Max fascial restrictions noted in left upper arm, upper trapezius, and scapularis region. ? ? ? ? ? ?PATIENT EDUCATION: ?Education details: scar massage, HEP: table slides (no er), A/ROM wrist and elbow. Reviewed protocol.  ?Person educated: Patient ?Education method: Explanation, Demonstration, and Handouts ?Education comprehension: verbalized understanding and returned demonstration ? ? ?HOME EXERCISE PROGRAM: ?Eval: table slides: no er per protocol. A/ROM wrist and elbow. ? ?GOALS: ?Goals reviewed with patient? Yes ? ?SHORT TERM GOALS: Target date: 12/30/2021 ? ?Patient will increase left UE P/ROM to [facility] in order to increase ability to complete dressing tasks with less difficulty. ? ?Goal status: INITIAL ? ?2.  Patient will increase LUE strength to 3/5 in order to complete reaching tasks to at least shoulder level with more shoulder stability and endurance.  ? ?Goal status: INITIAL ? ?3.  Patient will reports a pain level of approximately 5/10 or less when using his LUE to complete basic ADL tasks at home.  ? ?  Goal status: INITIAL ? ?4.  Patient will be educated and independent with HEP in order to facilitate his progress in therapy and work towards using his LUE for all daily and leisure tasks.  ? ?Goal status: INITIAL ? ?5.  Patient will decrease LUE fascial restrictions to Moderate amount in order to  increase the functional mobility needed to complete mid level reaching tasks.  ? ?Goal status: INITIAL ? ? ? ?LONG TERM GOALS: Target date: 02/10/2022 ? ?Patient will increase left UE A/ROM to [facility] in order to increase ability to complete dressing tasks and functional reaching tasks with less difficulty. ? ?Goal status: INITIAL ? ?2.  Patient will increase LUE strength to 4+/5 in order to complete gardening activities with more shoulder stability and endurance.  ? ?Goal status: INITIAL ? ?3.  Patient will reports a pain level of approximately 2/10 or less when using his LUE to complete basic ADL tasks at home.  ? ?Goal status: INITIAL ? ?4.   Patient will decrease LUE fascial restrictions to Minimal amount in order to increase the functional mobility needed to complete high level reaching tasks.  ? ?Goal status: INITIAL ? ? ? ?ASSESSMENT: ? ?CLINICAL IMPRESSION: ?Patient is a 71 y.o. male who was seen today for occupational therapy evaluation for S/P left shoulder RCR.  ? ?PERFORMANCE DEFICITS in functional skills including ADLs, IADLs, ROM, strength, pain, fascial restrictions, mobility, endurance, decreased knowledge of precautions, and UE functional use,  ? ?IMPAIRMENTS are limiting patient from ADLs, IADLs, rest and sleep, and leisure.  ? ?COMORBIDITIES may have co-morbidities  that affects occupational performance. Patient will benefit from skilled OT to address above impairments and improve overall function. ? ?MODIFICATION OR ASSISTANCE TO COMPLETE EVALUATION: Min-Moderate modification of tasks or assist with assess necessary to complete an evaluation. ? ?OT OCCUPATIONAL PROFILE AND HISTORY: Problem focused assessment: Including review of records relating to presenting problem. ? ?CLINICAL DECISION MAKING: Moderate - several treatment options, min-mod task modification necessary ? ?REHAB POTENTIAL: Excellent ? ?EVALUATION COMPLEXITY: Moderate ? ? ? ? ? ?PLAN: ?OT FREQUENCY: 2x/week ? ?OT DURATION: 12  weeks ? ?PLANNED INTERVENTIONS: self care/ADL training, therapeutic exercise, therapeutic activity, neuromuscular re-education, manual therapy, passive range of motion, electrical stimulation, ultrasound, moist heat, cryotherapy, patient/family education, and DME and/or AE instructions ? ? ? ?CONSULTED AND AGREED WITH PLAN OF CARE: Patient ? ?PLAN FOR NEXT SESSION: Follow protocol. Complete FOTO. ? ? ?Ailene Ravel, OTR/L,CBIS  ?[phone] ? ?11/18/2021, 11:22 AM ?  ?

## 2021-11-18 NOTE — Patient Instructions (Signed)
TOWEL SLIDES COMPLETE FOR 1-3 MINUTES, 3-5 TIMES PER DAY ? ?SHOULDER: Flexion On Table ? ? ?Place hands on table, elbows straight. Move hips away from body. Press hands down into table.  ?Abduction (Passive) ? ? ?With arm out to side, resting on table, lower head toward arm, keeping trunk away from table.  ? ?Copyright ? [employer]. All rights reserved.  ? ? ? ?Internal Rotation (Assistive) ? ? ?Seated with elbow bent at right angle and held against side, slide arm on table surface in an inward arc. ? ?Activity: Use this motion to brush crumbs off the table. ? ? ?Copyright ? [employer]. All rights reserved.  ?AROM: Wrist Extension ? ? ?With right palm down, bend wrist up. ?Repeat 10____ times per set. Do ____ sets per session. Do __3__ sessions per day. ? ?Copyright ? [employer]. All rights reserved.  ? ?AROM: Wrist Flexion ? ? ?With right palm up, bend wrist up. ?Repeat ___10_ times per set. Do ____ sets per session. Do __3__ sessions per day. ? ?Copyright ? [employer]. All rights reserved.  ? ?AROM: Forearm Pronation / Supination ? ? ?With right arm in handshake position, slowly rotate palm down until stretch is felt. Relax. Then rotate palm up until stretch is felt. ?Repeat __10__ times per set. Do ____ sets per session. Do __3__ sessions per day. ? ?Copyright ? [employer]. All rights reserved.  ? ?ELBOW FLEXION EXTENSION ?  ?Start with your arm at your side. Bend at your elbow to raise your forearm/hand upwards as shown. Then return to starting position and repeat.  Complete 10 times.  ? ?

## 2021-11-21 ENCOUNTER — Encounter (HOSPITAL_COMMUNITY): Payer: Self-pay | Admitting: Occupational Therapy

## 2021-11-21 ENCOUNTER — Other Ambulatory Visit: Payer: Self-pay

## 2021-11-21 ENCOUNTER — Ambulatory Visit (HOSPITAL_COMMUNITY): Payer: Federal, State, Local not specified - PPO | Admitting: Occupational Therapy

## 2021-11-21 DIAGNOSIS — M25512 Pain in left shoulder: Secondary | ICD-10-CM | POA: Diagnosis not present

## 2021-11-21 DIAGNOSIS — R29898 Other symptoms and signs involving the musculoskeletal system: Secondary | ICD-10-CM

## 2021-11-21 DIAGNOSIS — M25612 Stiffness of left shoulder, not elsewhere classified: Secondary | ICD-10-CM

## 2021-11-21 NOTE — Therapy (Signed)
?OUTPATIENT OCCUPATIONAL THERAPY TREATMENT NOTE ? ? ?Patient Name: Jason Espinoza ?MRN: [id number] ?DOB:02/15/1951, 71 y.o., male ?Today's Date: 11/21/2021 ? ?PCP: Geraldyn Shain Andrea, MD ?REFERRING PROVIDER:Dr. Tania Ade ? ? OT End of Session - 11/21/21 0934   ? ? Visit Number 2   ? Number of Visits 24   ? Date for OT Re-Evaluation 02/10/22   mini reassess: 12/16/21  ? Authorization Type BCBS, no copay   ? Authorization Time Period 25 visit limit combined PT/OT/ST; 1 used   ? Authorization - Visit Number 3   ? Authorization - Number of Visits 25   ? OT Start Time [phone]   ? OT Stop Time [phone]   ? OT Time Calculation (min) 41 min   ? Activity Tolerance Patient tolerated treatment well   ? Behavior During Therapy [facility] for tasks assessed/performed   ? ?  ?  ? ?  ? ? ?History reviewed. No pertinent past medical history. ?History reviewed. No pertinent surgical history. ?Patient Active Problem List  ? Diagnosis Date Noted  ? OA (osteoarthritis) of shoulder 09/19/2021  ? Traumatic tear of supraspinatus tendon of left shoulder 08/19/2021  ? Full thickness tear of left subscapularis tendon 08/19/2021  ? ? ?ONSET DATE: 10/28/2021 ? ?REFERRING DIAG: S/P left shoulder arthroscopic RCR with subscapularis repair, SAD, biceps tenotomy ? ?THERAPY DIAG:  ?Acute pain of left shoulder ? ?Other symptoms and signs involving the musculoskeletal system ? ?Stiffness of left shoulder, not elsewhere classified ? ? ?PERTINENT HISTORY: Pt is a 71 y/o male S/P left shoulder arthroscopy with subscapularis repair, SAD, and biceps tenotomy completed on 10/28/21. ? ?PRECAUTIONS: shoulder. See protocol. No er for 6 weeks ? ?SUBJECTIVE: S: It only hurts if I move it wrong.  ? ?PAIN:  ?Are you having pain? No ? ? ? ? ?OBJECTIVE: From Evaluation:  ? ?UE ROM    ?  ?Passive ROM Left ?11/18/2021  ?Shoulder flexion 80  ?Shoulder abduction 85  ?Shoulder internal rotation 90  ?Shoulder external rotation Not completed due to protocol  ?(Blank rows = not tested) ?   ?A/ROM not completed due to protocol. ?Active ROM Left ?11/18/2021  ?Shoulder flexion    ?Shoulder abduction    ?Shoulder internal rotation    ?Shoulder external rotation    ?(Blank rows = not tested) ?  ?  ?  ?UE MMT:    ?Strength not assessed due to protocol. ?MMT Left ?11/18/2021  ?Shoulder flexion    ?Shoulder abduction    ?Shoulder internal rotation    ?Shoulder external rotation    ?(Blank rows = not tested) ? ? ?TODAY'S TREATMENT: ? ?11/21/21:  ?-Myofascial release: completed separately from therapeutic exercises. Myofascial release to left upper arm, shoulder, trapezius, and scapular regions to decrease pain and fascial restrictions and increase joint ROM ?-P/ROM: shoulder flexion, horizontal abduction, abduction, 10X each ?-Scapular A/ROM: row, extension, scapular depression, 10X each ?-Therapy ball stretches: flexion, abduction, 10X each with 3" hold at end ROM ?-pendulums: side to side, forward/back, circles each direction, 1' each ? ? ? ?SHORT TERM GOALS: Target date: 12/30/2021 ?  ?Patient will increase left UE P/ROM to [facility] in order to increase ability to complete dressing tasks with less difficulty. ?  ?Goal status: Ongoing ?  ?2.  Patient will increase LUE strength to 3/5 in order to complete reaching tasks to at least shoulder level with more shoulder stability and endurance.  ?  ?Goal status: Ongoing ?  ?3.  Patient will reports a pain level of approximately 5/10  or less when using his LUE to complete basic ADL tasks at home.  ?  ?Goal status: Ongoing ?  ?4.  Patient will be educated and independent with HEP in order to facilitate his progress in therapy and work towards using his LUE for all daily and leisure tasks.  ?  ?Goal status:Ongoing ?  ?5.  Patient will decrease LUE fascial restrictions to Moderate amount in order to increase the functional mobility needed to complete mid level reaching tasks.  ?  ?Goal status: Ongoing ?  ?  ?  ?LONG TERM GOALS: Target date: 02/10/2022 ?  ?Patient will increase  left UE A/ROM to [facility] in order to increase ability to complete dressing tasks and functional reaching tasks with less difficulty. ?  ?Goal status: Ongoing ?  ?2.  Patient will increase LUE strength to 4+/5 in order to complete gardening activities with more shoulder stability and endurance.  ?  ?Goal status: Ongoing ?  ?3.  Patient will reports a pain level of approximately 2/10 or less when using his LUE to complete basic ADL tasks at home.  ?  ?Goal status: Ongoing ?  ?4.   Patient will decrease LUE fascial restrictions to Minimal amount in order to increase the functional mobility needed to complete high level reaching tasks.  ?  ?Goal status: Ongoing ?  ? ? ? ?   ?OT Assessment and Plan  ?Clinical Impression Statement A: Pt reports he may have done too much yesterday with his arm. Initiated myofascial release to LUE to address fascial restrictions. Followed protocol for P/ROM, pt tolerating flexion and abduction to approximately 90 degrees, occasional mild catching during abduction but resolved immediately. Pt completing scapular A/ROM, therapy ball stretches, and pendulums. Verbal cuing for form and technique.  ?Plan P: Continue working towards improved P/ROM  ?Consulted and Agree with Plan of Care Patient  ? ? ?Guadelupe Sabin, OTR/L  ?[phone] ?11/21/2021, 10:36 AM ? ?  ? ? ? ?

## 2021-11-24 ENCOUNTER — Other Ambulatory Visit: Payer: Self-pay

## 2021-11-24 ENCOUNTER — Other Ambulatory Visit: Payer: Self-pay | Admitting: Family Medicine

## 2021-11-24 ENCOUNTER — Ambulatory Visit (HOSPITAL_COMMUNITY): Payer: Federal, State, Local not specified - PPO | Admitting: Occupational Therapy

## 2021-11-24 ENCOUNTER — Encounter (HOSPITAL_COMMUNITY): Payer: Self-pay | Admitting: Occupational Therapy

## 2021-11-24 DIAGNOSIS — M25612 Stiffness of left shoulder, not elsewhere classified: Secondary | ICD-10-CM

## 2021-11-24 DIAGNOSIS — R29898 Other symptoms and signs involving the musculoskeletal system: Secondary | ICD-10-CM

## 2021-11-24 DIAGNOSIS — M25512 Pain in left shoulder: Secondary | ICD-10-CM

## 2021-11-24 NOTE — Therapy (Signed)
?OUTPATIENT OCCUPATIONAL THERAPY TREATMENT NOTE ? ? ?Patient Name: Jason Espinoza ?MRN: [id number] ?DOB:1951/06/29, 71 y.o., male ?Today's Date: 11/24/2021 ? ?PCP: Mouna Yager Andrea, MD ?REFERRING PROVIDER:Dr. Tania Ade ? ? OT End of Session - 11/24/21 1420   ? ? Visit Number 3   ? Number of Visits 24   ? Date for OT Re-Evaluation 02/10/22   mini reassess: 12/16/21  ? Authorization Type [employer], no copay   ? Authorization Time Period 25 visit limit combined PT/OT/ST; 1 used   ? Authorization - Visit Number 4   ? Authorization - Number of Visits 25   ? OT Start Time 1303   ? OT Stop Time 1341   ? OT Time Calculation (min) 38 min   ? Activity Tolerance Patient tolerated treatment well   ? Behavior During Therapy [facility] for tasks assessed/performed   ? ?  ?  ? ?  ? ? ? ?History reviewed. No pertinent past medical history. ?History reviewed. No pertinent surgical history. ?Patient Active Problem List  ? Diagnosis Date Noted  ? OA (osteoarthritis) of shoulder 09/19/2021  ? Traumatic tear of supraspinatus tendon of left shoulder 08/19/2021  ? Full thickness tear of left subscapularis tendon 08/19/2021  ? ? ?ONSET DATE: 10/28/2021 ? ?REFERRING DIAG: S/P left shoulder arthroscopic RCR with subscapularis repair, SAD, biceps tenotomy ? ?THERAPY DIAG:  ?Acute pain of left shoulder ? ?Other symptoms and signs involving the musculoskeletal system ? ?Stiffness of left shoulder, not elsewhere classified ? ? ?PERTINENT HISTORY: Pt is a 71 y/o male S/P left shoulder arthroscopy with subscapularis repair, SAD, and biceps tenotomy completed on 10/28/21. ? ?PRECAUTIONS: shoulder. See protocol. No er for 6 weeks ? ?SUBJECTIVE: S: I can almost wash my hair now.  ? ?PAIN:  ?Are you having pain? No ? ? ? ? ?OBJECTIVE: From Evaluation:  ? ?UE ROM    ?  ?Passive ROM Left ?11/18/2021  ?Shoulder flexion 80  ?Shoulder abduction 85  ?Shoulder internal rotation 90  ?Shoulder external rotation Not completed due to protocol  ?(Blank rows = not tested) ?   ?A/ROM not completed due to protocol. ?Active ROM Left ?11/18/2021  ?Shoulder flexion    ?Shoulder abduction    ?Shoulder internal rotation    ?Shoulder external rotation    ?(Blank rows = not tested) ?  ?  ?  ?UE MMT:    ?Strength not assessed due to protocol. ?MMT Left ?11/18/2021  ?Shoulder flexion    ?Shoulder abduction    ?Shoulder internal rotation    ?Shoulder external rotation    ?(Blank rows = not tested) ? ? ?TODAY'S TREATMENT: ? ?11/24/21 ?-Myofascial release: completed separately from therapeutic exercises. Myofascial release to left upper arm, shoulder, trapezius, and scapular regions to decrease pain and fascial restrictions and increase joint ROM ?-P/ROM: shoulder flexion, horizontal abduction, abduction, 10X each ?-Scapular A/ROM: row, extension, scapular depression, 12X each ?-Therapy ball stretches: flexion, abduction, 12X each with 3" hold at end ROM ?-Pulleys: 1' flexion, 1' abduction ? ?11/21/21:  ?-Myofascial release: completed separately from therapeutic exercises. Myofascial release to left upper arm, shoulder, trapezius, and scapular regions to decrease pain and fascial restrictions and increase joint ROM ?-P/ROM: shoulder flexion, horizontal abduction, abduction, 10X each ?-Scapular A/ROM: row, extension, scapular depression, 10X each ?-Therapy ball stretches: flexion, abduction, 10X each with 3" hold at end ROM ?-pendulums: side to side, forward/back, circles each direction, 1' each ? ?   ?OT Education    ?  ?  Education Details Scapular A/ROM  ?  Person(s) Educated Patient   ?  Methods Explanation;Demonstration;Handout   ?  Comprehension Verbalized understanding;Returned demonstration   ? ? ? ? ?SHORT TERM GOALS: Target date: 12/30/2021 ?  ?Patient will increase left UE P/ROM to [facility] in order to increase ability to complete dressing tasks with less difficulty. ?  ?Goal status: Ongoing ?  ?2.  Patient will increase LUE strength to 3/5 in order to complete reaching tasks to at least shoulder  level with more shoulder stability and endurance.  ?  ?Goal status: Ongoing ?  ?3.  Patient will reports a pain level of approximately 5/10 or less when using his LUE to complete basic ADL tasks at home.  ?  ?Goal status: Ongoing ?  ?4.  Patient will be educated and independent with HEP in order to facilitate his progress in therapy and work towards using his LUE for all daily and leisure tasks.  ?  ?Goal status:Ongoing ?  ?5.  Patient will decrease LUE fascial restrictions to Moderate amount in order to increase the functional mobility needed to complete mid level reaching tasks.  ?  ?Goal status: Ongoing ?  ?  ?  ?LONG TERM GOALS: Target date: 02/10/2022 ?  ?Patient will increase left UE A/ROM to [facility] in order to increase ability to complete dressing tasks and functional reaching tasks with less difficulty. ?  ?Goal status: Ongoing ?  ?2.  Patient will increase LUE strength to 4+/5 in order to complete gardening activities with more shoulder stability and endurance.  ?  ?Goal status: Ongoing ?  ?3.  Patient will reports a pain level of approximately 2/10 or less when using his LUE to complete basic ADL tasks at home.  ?  ?Goal status: Ongoing ?  ?4.   Patient will decrease LUE fascial restrictions to Minimal amount in order to increase the functional mobility needed to complete high level reaching tasks.  ?  ?Goal status: Ongoing ?  ? ? ? ?   ?OT Assessment and Plan  ?Clinical Impression Statement A: Continued with myofascial release to address fascial restrictions in LUE and trapezius regions. Continued to follow protocol for P/ROM, pt tolerating flexion and abduction to approximately 90 degrees, occasional mild catching during horizontal abduction but resolved immediately. Pt completing scapular A/ROM, therapy ball stretches, and pulleys. Verbal cuing for form and technique.  ?Plan P: Follow up on HEP. Continue working towards improved P/ROM, add low level thumb tacks, prot/ret/elev/dep  ?Consulted and Agree with  Plan of Care Patient  ? ? ?Guadelupe Sabin, OTR/L  ?[phone] ?11/24/2021, 2:20 PM ? ?  ? ? ? ?

## 2021-11-24 NOTE — Patient Instructions (Signed)
1) Seated Row   Sit up straight with elbows by your sides. Pull back with shoulders/elbows, keeping forearms straight, as if pulling back on the reins of a horse. Squeeze shoulder blades together. Repeat _10-15__times, __2-3__sets/day    2) Shoulder Elevation    Sit up straight with arms by your sides. Slowly bring your shoulders up towards your ears. Repeat_10-15__times, __2-3__ sets/day    3) Shoulder Extension    Sit up straight with both arms by your side, draw your arms back behind your waist. Keep your elbows straight. Repeat __10-15__times, __2-3__sets/day.       

## 2021-11-26 ENCOUNTER — Ambulatory Visit (HOSPITAL_COMMUNITY): Payer: Federal, State, Local not specified - PPO

## 2021-11-26 ENCOUNTER — Encounter (HOSPITAL_COMMUNITY): Payer: Self-pay

## 2021-11-26 DIAGNOSIS — R29898 Other symptoms and signs involving the musculoskeletal system: Secondary | ICD-10-CM

## 2021-11-26 DIAGNOSIS — M25612 Stiffness of left shoulder, not elsewhere classified: Secondary | ICD-10-CM

## 2021-11-26 DIAGNOSIS — M25512 Pain in left shoulder: Secondary | ICD-10-CM

## 2021-11-26 NOTE — Therapy (Signed)
?OUTPATIENT OCCUPATIONAL THERAPY TREATMENT NOTE ? ? ?Patient Name: Jason Espinoza ?MRN: [id number] ?DOB:08-Jul-1951, 71 y.o., male ?Today's Date: 11/26/2021 ? ?PCP: Leslie Andrea, MD ?REFERRING PROVIDER:Dr. Tania Ade ? ? OT End of Session - 11/26/21 1332   ? ? Visit Number 4   ? Number of Visits 24   ? Date for OT Re-Evaluation 02/10/22   mini reassess: 12/16/21  ? Authorization Type [employer], no copay   ? Authorization Time Period 25 visit limit combined PT/OT/ST; 1 used   ? Authorization - Visit Number 5   ? Authorization - Number of Visits 25   ? OT Start Time 1308   checked in late  ? OT Stop Time 1338   ? OT Time Calculation (min) 30 min   ? Activity Tolerance Patient tolerated treatment well   ? Behavior During Therapy [facility] for tasks assessed/performed   ? ?  ?  ? ?  ? ? ? ? ?History reviewed. No pertinent past medical history. ?History reviewed. No pertinent surgical history. ?Patient Active Problem List  ? Diagnosis Date Noted  ? OA (osteoarthritis) of shoulder 09/19/2021  ? Traumatic tear of supraspinatus tendon of left shoulder 08/19/2021  ? Full thickness tear of left subscapularis tendon 08/19/2021  ? ? ?ONSET DATE: 10/28/2021 ? ?REFERRING DIAG: S/P left shoulder arthroscopic RCR with subscapularis repair, SAD, biceps tenotomy ? ?THERAPY DIAG:  ?Acute pain of left shoulder ? ?Other symptoms and signs involving the musculoskeletal system ? ?Stiffness of left shoulder, not elsewhere classified ? ? ?PERTINENT HISTORY: Pt is a 71 y/o male S/P left shoulder arthroscopy with subscapularis repair, SAD, and biceps tenotomy completed on 10/28/21. ? ?PRECAUTIONS: shoulder. See protocol. No er for 6 weeks ? ?SUBJECTIVE: S: I used to have a lot of pain in the back of the shoulder blade.  ? ?PAIN:  ?Are you having pain? Yes: NPRS scale: 2/10 ?Pain location: left shoulder ?Pain description: sore ?Aggravating factors: moving the wrong way or too much. ?Relieving factors: nothing needed. ? ? ? ? ?OBJECTIVE: From  Evaluation:  ? ?UE ROM    ?  ?Passive ROM Left ?11/18/2021  ?Shoulder flexion 80  ?Shoulder abduction 85  ?Shoulder internal rotation 90  ?Shoulder external rotation Not completed due to protocol  ?(Blank rows = not tested) ?  ?A/ROM not completed due to protocol. ?Active ROM Left ?11/18/2021  ?Shoulder flexion    ?Shoulder abduction    ?Shoulder internal rotation    ?Shoulder external rotation    ?(Blank rows = not tested) ?  ?  ?  ?UE MMT:    ?Strength not assessed due to protocol. ?MMT Left ?11/18/2021  ?Shoulder flexion    ?Shoulder abduction    ?Shoulder internal rotation    ?Shoulder external rotation    ?(Blank rows = not tested) ? ? ?TODAY'S TREATMENT: ? 11/26/21 ?- Myofascial release: completed separately from therapeutic exercises. Myofascial release to left upper arm, shoulder, trapezius, and scapular regions to decrease pain and fascial restrictions and increase joint ROM ?- P/ROM: shoulder flexion, horizontal abduction, abduction, 10X each ?- Serratus anterior punch: OT supported arm. 10X ?- Thumb tacks low level 1' ?- Pro/ret/elev/dep 1' ?-Scapular A/ROM: row, extension, scapular depression, 12X each ?-Therapy ball stretches: flexion, abduction, 12X each with 3" hold at end ROM ? ? ? ?11/24/21 ?-Myofascial release: completed separately from therapeutic exercises. Myofascial release to left upper arm, shoulder, trapezius, and scapular regions to decrease pain and fascial restrictions and increase joint ROM ?-P/ROM: shoulder flexion, horizontal abduction, abduction, 10X  each ?-Scapular A/ROM: row, extension, scapular depression, 12X each ?-Therapy ball stretches: flexion, abduction, 12X each with 3" hold at end ROM ?-Pulleys: 1' flexion, 1' abduction ? ? ?   ?OT Education    ?  ?  Education Details   ?  Person(s) Educated   ?  Methods   ?  Comprehension    ? ?HOME EXERCISE PROGRAM: ?Eval: table slides: no er per protocol. A/ROM wrist and elbow. 3/27: scapular A/ROM ? ? ?SHORT TERM GOALS: Target date:  12/30/2021 ?  ?Patient will increase left UE P/ROM to [facility] in order to increase ability to complete dressing tasks with less difficulty. ?  ?Goal status: Ongoing ?  ?2.  Patient will increase LUE strength to 3/5 in order to complete reaching tasks to at least shoulder level with more shoulder stability and endurance.  ?  ?Goal status: Ongoing ?  ?3.  Patient will reports a pain level of approximately 5/10 or less when using his LUE to complete basic ADL tasks at home.  ?  ?Goal status: Ongoing ?  ?4.  Patient will be educated and independent with HEP in order to facilitate his progress in therapy and work towards using his LUE for all daily and leisure tasks.  ?  ?Goal status:Ongoing ?  ?5.  Patient will decrease LUE fascial restrictions to Moderate amount in order to increase the functional mobility needed to complete mid level reaching tasks.  ?  ?Goal status: Ongoing ?  ?  ?  ?LONG TERM GOALS: Target date: 02/10/2022 ?  ?Patient will increase left UE A/ROM to [facility] in order to increase ability to complete dressing tasks and functional reaching tasks with less difficulty. ?  ?Goal status: Ongoing ?  ?2.  Patient will increase LUE strength to 4+/5 in order to complete gardening activities with more shoulder stability and endurance.  ?  ?Goal status: Ongoing ?  ?3.  Patient will reports a pain level of approximately 2/10 or less when using his LUE to complete basic ADL tasks at home.  ?  ?Goal status: Ongoing ?  ?4.   Patient will decrease LUE fascial restrictions to Minimal amount in order to increase the functional mobility needed to complete high level reaching tasks.  ?  ?Goal status: Ongoing ?  ? ? ? ? 11/26/21 1332  ?OT Assessment and Plan  ?Clinical Impression Statement A: Completed myofascial release to address fascial restrictions. Added thumb tacks, pro/ret/elev/dep, serratus anterior punch to focus on scapular mobility. VC for form and technique were provided.  ?Pt will benefit from skilled therapeutic  intervention in order to improve on the following performance deficits Body Structure / Function / Physical Skills  ?Body Structure / Function / Physical Skills ADL;Endurance;Muscle spasms;UE functional use;Fascial restriction;Pain;ROM;IADL;Strength;Mobility;Decreased knowledge of precautions  ?Plan P: Begin next phase of protocol for week 5. Initiate AA/ROM supine. Prone row to a neutral position.  ?Consulted and Agree with Plan of Care Patient  ? ? ?Ailene Ravel, OTR/L,CBIS  ?[phone] ? ?11/26/2021, 1:46 PM ? ?  ? ? ? ?

## 2021-12-02 ENCOUNTER — Ambulatory Visit (HOSPITAL_COMMUNITY): Payer: Federal, State, Local not specified - PPO | Attending: Surgical | Admitting: Occupational Therapy

## 2021-12-02 ENCOUNTER — Encounter (HOSPITAL_COMMUNITY): Payer: Self-pay | Admitting: Occupational Therapy

## 2021-12-02 DIAGNOSIS — M25612 Stiffness of left shoulder, not elsewhere classified: Secondary | ICD-10-CM | POA: Insufficient documentation

## 2021-12-02 DIAGNOSIS — M25512 Pain in left shoulder: Secondary | ICD-10-CM | POA: Insufficient documentation

## 2021-12-02 DIAGNOSIS — R29898 Other symptoms and signs involving the musculoskeletal system: Secondary | ICD-10-CM | POA: Diagnosis present

## 2021-12-02 NOTE — Patient Instructions (Signed)

## 2021-12-02 NOTE — Therapy (Signed)
?OUTPATIENT OCCUPATIONAL THERAPY TREATMENT NOTE ? ? ?Patient Name: Jason Espinoza ?MRN: [id number] ?DOB:04-Mar-1951, 71 y.o., male ?Today's Date: 12/02/2021 ? ?PCP: Gauri Galvao Andrea, MD ?REFERRING PROVIDER:Dr. Tania Ade ? ? OT End of Session - 12/02/21 1115   ? ? Visit Number 5   ? Number of Visits 24   ? Date for OT Re-Evaluation 02/10/22   mini reassess: 12/16/21  ? Authorization Type BC[employer], no copay   ? Authorization Time Period 25 visit limit combined PT/OT/ST; 1 used   ? Authorization - Visit Number 6   ? Authorization - Number of Visits 25   ? OT Start Time [phone]   ? OT Stop Time 1030   ? OT Time Calculation (min) 42 min   ? Activity Tolerance Patient tolerated treatment well   ? Behavior During Therapy [facility] for tasks assessed/performed   ? ?  ?  ? ?  ? ? ? ? ? ?History reviewed. No pertinent past medical history. ?History reviewed. No pertinent surgical history. ?Patient Active Problem List  ? Diagnosis Date Noted  ? OA (osteoarthritis) of shoulder 09/19/2021  ? Traumatic tear of supraspinatus tendon of left shoulder 08/19/2021  ? Full thickness tear of left subscapularis tendon 08/19/2021  ? ? ?ONSET DATE: 10/28/2021 ? ?REFERRING DIAG: S/P left shoulder arthroscopic RCR with subscapularis repair, SAD, biceps tenotomy ? ?THERAPY DIAG:  ?Acute pain of left shoulder ? ?Other symptoms and signs involving the musculoskeletal system ? ?Stiffness of left shoulder, not elsewhere classified ? ? ?PERTINENT HISTORY: Pt is a 71 y/o male S/P left shoulder arthroscopy with subscapularis repair, SAD, and biceps tenotomy completed on 10/28/21. ? ?PRECAUTIONS: shoulder. See protocol. No er for 6 weeks ? ?SUBJECTIVE: S: I can tell I have more range of motion than I had before the surgery. ? ?PAIN:  ?Are you having pain? Yes: NPRS scale: 0/10 ?Pain location:   ?Pain description:   ?Aggravating factors:   ?Relieving factors:   ? ? ? ? ?OBJECTIVE: From Evaluation:  ? ?UE ROM    ?  ?Passive ROM Left ?11/18/2021  ?Shoulder flexion 80   ?Shoulder abduction 85  ?Shoulder internal rotation 90  ?Shoulder external rotation Not completed due to protocol  ?(Blank rows = not tested) ?  ?A/ROM not completed due to protocol. ?Active ROM Left ?11/18/2021  ?Shoulder flexion    ?Shoulder abduction    ?Shoulder internal rotation    ?Shoulder external rotation    ?(Blank rows = not tested) ?  ?  ?  ?UE MMT:    ?Strength not assessed due to protocol. ?MMT Left ?11/18/2021  ?Shoulder flexion    ?Shoulder abduction    ?Shoulder internal rotation    ?Shoulder external rotation    ?(Blank rows = not tested) ? ? ?TODAY'S TREATMENT: ? 12/02/21 ?-Myofascial release: completed separately from therapeutic exercises. Myofascial release to left upper arm, shoulder, trapezius, and scapular regions to decrease pain and fascial restrictions and increase joint ROM ?-P/ROM: shoulder flexion, horizontal abduction, abduction, 10X each ?  -AA/ROM: shoulder protraction, flexion, horizontal abduction, er/IR, abduction, 10X each supine ? -Pulleys, 1' flexion 1' abduction ? ? ?11/26/21 ?- Myofascial release: completed separately from therapeutic exercises. Myofascial release to left upper arm, shoulder, trapezius, and scapular regions to decrease pain and fascial restrictions and increase joint ROM ?- P/ROM: shoulder flexion, horizontal abduction, abduction, 10X each ?- Serratus anterior punch: OT supported arm. 10X ?- Thumb tacks low level 1' ?- Pro/ret/elev/dep 1' ?-Scapular A/ROM: row, extension, scapular depression, 12X each ?-Therapy ball  stretches: flexion, abduction, 12X each with 3" hold at end ROM ? ? ? ?11/24/21 ?-Myofascial release: completed separately from therapeutic exercises. Myofascial release to left upper arm, shoulder, trapezius, and scapular regions to decrease pain and fascial restrictions and increase joint ROM ?-P/ROM: shoulder flexion, horizontal abduction, abduction, 10X each ?-Scapular A/ROM: row, extension, scapular depression, 12X each ?-Therapy ball stretches:  flexion, abduction, 12X each with 3" hold at end ROM ?-Pulleys: 1' flexion, 1' abduction ? ? ?   ?OT Education    ?  ?  Education Details AA/ROM  ?  Person(s) Educated patient  ?  Methods Explanation, demonstration  ?  Comprehension Verbalized understanding, returned demonstration  ? ?HOME EXERCISE PROGRAM: ?Eval: table slides: no er per protocol. A/ROM wrist and elbow. 3/27: scapular A/ROM; 4/4: AA/ROM in supine ? ? ?SHORT TERM GOALS: Target date: 12/30/2021 ?  ?Patient will increase left UE P/ROM to [facility] in order to increase ability to complete dressing tasks with less difficulty. ?  ?Goal status: Ongoing ?  ?2.  Patient will increase LUE strength to 3/5 in order to complete reaching tasks to at least shoulder level with more shoulder stability and endurance.  ?  ?Goal status: Ongoing ?  ?3.  Patient will reports a pain level of approximately 5/10 or less when using his LUE to complete basic ADL tasks at home.  ?  ?Goal status: Ongoing ?  ?4.  Patient will be educated and independent with HEP in order to facilitate his progress in therapy and work towards using his LUE for all daily and leisure tasks.  ?  ?Goal status:Ongoing ?  ?5.  Patient will decrease LUE fascial restrictions to Moderate amount in order to increase the functional mobility needed to complete mid level reaching tasks.  ?  ?Goal status: Ongoing ?  ?  ?  ?LONG TERM GOALS: Target date: 02/10/2022 ?  ?Patient will increase left UE A/ROM to [facility] in order to increase ability to complete dressing tasks and functional reaching tasks with less difficulty. ?  ?Goal status: Ongoing ?  ?2.  Patient will increase LUE strength to 4+/5 in order to complete gardening activities with more shoulder stability and endurance.  ?  ?Goal status: Ongoing ?  ?3.  Patient will reports a pain level of approximately 2/10 or less when using his LUE to complete basic ADL tasks at home.  ?  ?Goal status: Ongoing ?  ?4.   Patient will decrease LUE fascial restrictions to Minimal  amount in order to increase the functional mobility needed to complete high level reaching tasks.  ?  ?Goal status: Ongoing ?  ? ? ? ?   ?OT Assessment and Plan  ?Clinical Impression Statement A: Completed myofascial release to address fascial restrictions. P/ROM completed, progressed to AA/ROM in supine. Pt achieving ROM to slightly greater than 50% range. Completed pulleys. Verbal cuing for form and technique, rest breaks provided as needed.   ?Pt will benefit from skilled therapeutic intervention in order to improve on the following performance deficits Body Structure / Function / Physical Skills  ?Body Structure / Function / Physical Skills ADL;Endurance;Muscle spasms;UE functional use;Fascial restriction;Pain;ROM;IADL;Strength;Mobility;Decreased knowledge of precautions  ?Plan P: Follow up on HEP, continue with AA/ROM and complete in standing as pt will be out for a week  ?Consulted and Agree with Plan of Care Patient  ? ? ? ? ?Guadelupe Sabin, OTR/L  ?[phone] ?12/02/2021, 11:15 AM ? ?  ? ? ? ?

## 2021-12-04 ENCOUNTER — Encounter (HOSPITAL_COMMUNITY): Payer: Self-pay | Admitting: Occupational Therapy

## 2021-12-04 ENCOUNTER — Ambulatory Visit (HOSPITAL_COMMUNITY): Payer: Federal, State, Local not specified - PPO | Admitting: Occupational Therapy

## 2021-12-04 DIAGNOSIS — R29898 Other symptoms and signs involving the musculoskeletal system: Secondary | ICD-10-CM

## 2021-12-04 DIAGNOSIS — M25512 Pain in left shoulder: Secondary | ICD-10-CM | POA: Diagnosis not present

## 2021-12-04 DIAGNOSIS — M25612 Stiffness of left shoulder, not elsewhere classified: Secondary | ICD-10-CM

## 2021-12-04 NOTE — Therapy (Signed)
?OUTPATIENT OCCUPATIONAL THERAPY TREATMENT NOTE ? ? ?Patient Name: Jason Espinoza ?MRN: [id number] ?DOB:04-Aug-1951, 71 y.o., male ?Today's Date: 12/04/2021 ? ?PCP: Mercy Leppla Andrea, MD ?REFERRING PROVIDER:Dr. Tania Ade ? ? OT End of Session - 12/04/21 1025   ? ? Visit Number 6   ? Number of Visits 24   ? Date for OT Re-Evaluation 02/10/22   mini reassess: 12/16/21  ? Authorization Type [employer], no copay   ? Authorization Time Period 25 visit limit combined PT/OT/ST; 1 used   ? Authorization - Visit Number 7   ? Authorization - Number of Visits 25   ? OT Start Time [phone]   ? OT Stop Time 1030   ? OT Time Calculation (min) 38 min   ? Activity Tolerance Patient tolerated treatment well   ? Behavior During Therapy [facility] for tasks assessed/performed   ? ?  ?  ? ?  ? ? ? ? ? ? ?History reviewed. No pertinent past medical history. ?History reviewed. No pertinent surgical history. ?Patient Active Problem List  ? Diagnosis Date Noted  ? OA (osteoarthritis) of shoulder 09/19/2021  ? Traumatic tear of supraspinatus tendon of left shoulder 08/19/2021  ? Full thickness tear of left subscapularis tendon 08/19/2021  ? ? ?ONSET DATE: 10/28/2021 ? ?REFERRING DIAG: S/P left shoulder arthroscopic RCR with subscapularis repair, SAD, biceps tenotomy ? ?THERAPY DIAG:  ?Acute pain of left shoulder ? ?Other symptoms and signs involving the musculoskeletal system ? ?Stiffness of left shoulder, not elsewhere classified ? ? ?PERTINENT HISTORY: Pt is a 71 y/o male S/P left shoulder arthroscopy with subscapularis repair, SAD, and biceps tenotomy completed on 10/28/21. ? ?PRECAUTIONS: shoulder. See protocol. No er for 6 weeks ? ?SUBJECTIVE: S: I took the brace off because it hurts my neck and shoulder.  ? ?PAIN:  ?Are you having pain? No ?Pain location:  ?Pain description:  ?Aggravating factors:  ?Relieving factors:  ? ? ? ? ?OBJECTIVE: From Evaluation:  ? ?UE ROM    ?  ?Passive ROM Left ?11/18/2021  ?Shoulder flexion 80  ?Shoulder abduction 85   ?Shoulder internal rotation 90  ?Shoulder external rotation Not completed due to protocol  ?(Blank rows = not tested) ?  ?A/ROM not completed due to protocol. ?Active ROM Left ?11/18/2021  ?Shoulder flexion    ?Shoulder abduction    ?Shoulder internal rotation    ?Shoulder external rotation    ?(Blank rows = not tested) ?  ?  ?  ?UE MMT:    ?Strength not assessed due to protocol. ?MMT Left ?11/18/2021  ?Shoulder flexion    ?Shoulder abduction    ?Shoulder internal rotation    ?Shoulder external rotation    ?(Blank rows = not tested) ? ? ?TODAY'S TREATMENT: ? 12/04/21 ?-Myofascial release: completed separately from therapeutic exercises. Myofascial release to left upper arm, shoulder, trapezius, and scapular regions to decrease pain and fascial restrictions and increase joint ROM ?-P/ROM: shoulder flexion, horizontal abduction, abduction, 10X each ?  -AA/ROM: shoulder protraction, flexion, horizontal abduction, er/IR, 10X each supine  ?  -AA/ROM: shoulder protraction, flexion, abduction, er/IR, 10X each standing ? ? ? ?12/02/21 ?-Myofascial release: completed separately from therapeutic exercises. Myofascial release to left upper arm, shoulder, trapezius, and scapular regions to decrease pain and fascial restrictions and increase joint ROM ?-P/ROM: shoulder flexion, horizontal abduction, abduction, 10X each ?  -AA/ROM: shoulder protraction, flexion, horizontal abduction, er/IR, abduction, 10X each supine ? -Pulleys, 1' flexion 1' abduction ? ? ? ? ? ? ?   ?OT Education    ?  ?  Education Details   ?  Person(s) Educated   ?  Methods   ?  Comprehension   ? ?HOME EXERCISE PROGRAM: ?Eval: table slides: no er per protocol. A/ROM wrist and elbow. 3/27: scapular A/ROM; 4/4: AA/ROM in supine ? ? ?SHORT TERM GOALS: Target date: 12/30/2021 ?  ?Patient will increase left UE P/ROM to [facility] in order to increase ability to complete dressing tasks with less difficulty. ?  ?Goal status: Ongoing ?  ?2.  Patient will increase LUE strength to  3/5 in order to complete reaching tasks to at least shoulder level with more shoulder stability and endurance.  ?  ?Goal status: Ongoing ?  ?3.  Patient will reports a pain level of approximately 5/10 or less when using his LUE to complete basic ADL tasks at home.  ?  ?Goal status: Ongoing ?  ?4.  Patient will be educated and independent with HEP in order to facilitate his progress in therapy and work towards using his LUE for all daily and leisure tasks.  ?  ?Goal status:Ongoing ?  ?5.  Patient will decrease LUE fascial restrictions to Moderate amount in order to increase the functional mobility needed to complete mid level reaching tasks.  ?  ?Goal status: Ongoing ?  ?  ?  ?LONG TERM GOALS: Target date: 02/10/2022 ?  ?Patient will increase left UE A/ROM to [facility] in order to increase ability to complete dressing tasks and functional reaching tasks with less difficulty. ?  ?Goal status: Ongoing ?  ?2.  Patient will increase LUE strength to 4+/5 in order to complete gardening activities with more shoulder stability and endurance.  ?  ?Goal status: Ongoing ?  ?3.  Patient will reports a pain level of approximately 2/10 or less when using his LUE to complete basic ADL tasks at home.  ?  ?Goal status: Ongoing ?  ?4.   Patient will decrease LUE fascial restrictions to Minimal amount in order to increase the functional mobility needed to complete high level reaching tasks.  ?  ?Goal status: Ongoing ?  ? ? ? ?   ?OT Assessment and Plan  ?Clinical Impression Statement A: Pt reports HEP is going well. Completed myofascial release to address fascial restrictions. P/ROM completed, continued with AA/ROM in supine and added in standing. Completed abduction in standing but not supine due to pain in supine. Verbal cuing for form and technique including depressing trapezius, rest breaks provided as needed.   ?Pt will benefit from skilled therapeutic intervention in order to improve on the following performance deficits Body Structure  / Function / Physical Skills  ?Body Structure / Function / Physical Skills ADL;Endurance;Muscle spasms;UE functional use;Fascial restriction;Pain;ROM;IADL;Strength;Mobility;Decreased knowledge of precautions  ?Plan P: Continue with AA/ROM, increasing repetitions to 12. Add proximal shoulder strengthening in supine and standing, wall wash  ?Consulted and Agree with Plan of Care Patient  ? ? ? ? ?Guadelupe Sabin, OTR/L  ?[phone] ?12/04/2021, 11:30 AM ? ?  ? ? ? ?

## 2021-12-11 ENCOUNTER — Encounter (HOSPITAL_COMMUNITY): Payer: Federal, State, Local not specified - PPO | Admitting: Occupational Therapy

## 2021-12-16 ENCOUNTER — Encounter (HOSPITAL_COMMUNITY): Payer: Self-pay | Admitting: Occupational Therapy

## 2021-12-16 ENCOUNTER — Ambulatory Visit (HOSPITAL_COMMUNITY): Payer: Federal, State, Local not specified - PPO | Admitting: Occupational Therapy

## 2021-12-16 DIAGNOSIS — R29898 Other symptoms and signs involving the musculoskeletal system: Secondary | ICD-10-CM

## 2021-12-16 DIAGNOSIS — M25512 Pain in left shoulder: Secondary | ICD-10-CM

## 2021-12-16 DIAGNOSIS — M25612 Stiffness of left shoulder, not elsewhere classified: Secondary | ICD-10-CM

## 2021-12-16 NOTE — Therapy (Signed)
?OUTPATIENT OCCUPATIONAL THERAPY TREATMENT NOTE ? ? ?Patient Name: Jason Espinoza ?MRN: [id number] ?DOB:May 13, 1951, 71 y.o., male ?Today's Date: 12/16/2021 ? ?PCP: Leslie Andrea, MD ?REFERRING PROVIDER:Dr. Tania Ade ? ? OT End of Session - 12/16/21 1358   ? ? Visit Number 7   ? Number of Visits 24   ? Date for OT Re-Evaluation 02/10/22   mini reassess: 12/16/21  ? Authorization Type [employer], no copay   ? Authorization Time Period 25 visit limit combined PT/OT/ST; 1 used   ? Authorization - Visit Number 8   ? Authorization - Number of Visits 25   ? OT Start Time 1305   ? OT Stop Time 1348   ? OT Time Calculation (min) 43 min   ? Activity Tolerance Patient tolerated treatment well   ? Behavior During Therapy [facility] for tasks assessed/performed   ? ?  ?  ? ?  ? ? ? ? ? ? ? ?History reviewed. No pertinent past medical history. ?History reviewed. No pertinent surgical history. ?Patient Active Problem List  ? Diagnosis Date Noted  ? OA (osteoarthritis) of shoulder 09/19/2021  ? Traumatic tear of supraspinatus tendon of left shoulder 08/19/2021  ? Full thickness tear of left subscapularis tendon 08/19/2021  ? ? ?ONSET DATE: 10/28/2021 ? ?REFERRING DIAG: S/P left shoulder arthroscopic RCR with subscapularis repair, SAD, biceps tenotomy ? ?THERAPY DIAG:  ?Acute pain of left shoulder ? ?Other symptoms and signs involving the musculoskeletal system ? ?Stiffness of left shoulder, not elsewhere classified ? ? ?PERTINENT HISTORY: Pt is a 71 y/o male S/P left shoulder arthroscopy with subscapularis repair, SAD, and biceps tenotomy completed on 10/28/21. ? ?PRECAUTIONS: shoulder. See protocol. No er for 6 weeks ? ?SUBJECTIVE: S: Pain is not really bad at all.  ? ?PAIN:  ?Are you having pain? yes ?Pain location: L shoulder  ?Pain description: sore, sharp pain ?Aggravating factors: using too much ?Relieving factors: none ? ? ? ? ?OBJECTIVE: From Evaluation:  ? ?UE ROM    ?  ?Passive ROM Left ?11/18/2021 Left (taken supine) ?12/16/2021    ?Shoulder flexion 80 147  ?Shoulder abduction 85 154  ?Shoulder internal rotation 90 90  ?Shoulder external rotation Not completed due to protocol 58  ?(Blank rows = not tested) ?  ?A/ROM not completed due to protocol. ?Active ROM Left ?11/18/2021 Left (seated adducted shoulder) ?12/16/2021  ?Shoulder flexion   110  ?Shoulder abduction   63  ?Shoulder internal rotation   90  ?Shoulder external rotation   60  ?(Blank rows = not tested) ?  ?  ?  ?UE MMT:    ?Strength not assessed due to protocol. ?MMT Left ?11/18/2021  ?Shoulder flexion    ?Shoulder abduction    ?Shoulder internal rotation    ?Shoulder external rotation    ?(Blank rows = not tested) ? ? ?TODAY'S TREATMENT: ? 12/16/21 ? --Myofascial release: completed separately from therapeutic exercises. Myofascial release to left upper arm, shoulder, trapezius, and scapular regions to decrease pain and fascial restrictions and increase joint ROM ? - Proximal shoulder girdle strength x10 reps without rest break.  ?  -AA/ROM: shoulder protraction, flexion, horizontal abduction, er/IR, 10X each supine  ?  -AA/ROM: shoulder protraction, flexion, abduction, er/IR, 10X each standing ? ?12/04/21 ?-Myofascial release: completed separately from therapeutic exercises. Myofascial release to left upper arm, shoulder, trapezius, and scapular regions to decrease pain and fascial restrictions and increase joint ROM ?-P/ROM: shoulder flexion, horizontal abduction, abduction, 10X each ?  -AA/ROM: shoulder protraction, flexion, horizontal abduction,  er/IR, 10X each supine  ?  -AA/ROM: shoulder protraction, flexion, abduction, er/IR, 10X each standing ? ? ? ?12/02/21 ?-Myofascial release: completed separately from therapeutic exercises. Myofascial release to left upper arm, shoulder, trapezius, and scapular regions to decrease pain and fascial restrictions and increase joint ROM ?-P/ROM: shoulder flexion, horizontal abduction, abduction, 10X each ?  -AA/ROM: shoulder protraction, flexion,  horizontal abduction, er/IR, abduction, 10X each supine ? -Pulleys, 1' flexion 1' abduction ? ? ? ? ? ? ?   ?OT Education    ?  ?  Education Details   ?  Person(s) Educated   ?  Methods   ?  Comprehension   ? ?HOME EXERCISE PROGRAM: ?Eval: table slides: no er per protocol. A/ROM wrist and elbow. 3/27: scapular A/ROM; 4/4: AA/ROM in supine ? ? ?SHORT TERM GOALS: Target date: 12/30/2021 ?  ?Patient will increase left UE P/ROM to [facility] in order to increase ability to complete dressing tasks with less difficulty. ?  ?Goal status: Ongoing ?  ?2.  Patient will increase LUE strength to 3/5 in order to complete reaching tasks to at least shoulder level with more shoulder stability and endurance.  ?  ?Goal status: Ongoing ?  ?3.  Patient will reports a pain level of approximately 5/10 or less when using his LUE to complete basic ADL tasks at home.  ?  ?4/18: Pt reports 3/10 pain during functional reaching ADL tasks.  ?Goal status: met  ?  ?4.  Patient will be educated and independent with HEP in order to facilitate his progress in therapy and work towards using his LUE for all daily and leisure tasks.  ?  ?Goal status:Ongoing ?  ?5.  Patient will decrease LUE fascial restrictions to Moderate amount in order to increase the functional mobility needed to complete mid level reaching tasks.  ? ? 4/18:Pt fascial restriction are moderate at this time mostly in the upper trapezius region.  ?Goal status: met ?  ?  ?  ?LONG TERM GOALS: Target date: 02/10/2022 ?  ?Patient will increase left UE A/ROM to [facility] in order to increase ability to complete dressing tasks and functional reaching tasks with less difficulty. ?  ?Goal status: Ongoing ?  ?2.  Patient will increase LUE strength to 4+/5 in order to complete gardening activities with more shoulder stability and endurance.  ?  ?Goal status: Ongoing ?  ?3.  Patient will reports a pain level of approximately 2/10 or less when using his LUE to complete basic ADL tasks at home.  ?  ?Goal status:  Ongoing ?  ?4.   Patient will decrease LUE fascial restrictions to Minimal amount in order to increase the functional mobility needed to complete high level reaching tasks.  ?  ?Goal status: Ongoing ?  ? ? ? ?   ?OT Assessment and Plan  ?Clinical Impression Statement A: Completed myofascial release to address fascial restrictions. P/ROM completed, continued with AA/ROM in supine and in standing. Added proximal shoulder strengthening in supine.  Completed abduction in supine and standing today. Improved pain after one rep of abduction in supine. A/ROM and P/ROM measurements also taken today. Verbal cuing for form and technique.   ?Pt will benefit from skilled therapeutic intervention in order to improve on the following performance deficits Body Structure / Function / Physical Skills  ?Body Structure / Function / Physical Skills ADL;Endurance;Muscle spasms;UE functional use;Fascial restriction;Pain;ROM;IADL;Strength;Mobility;Decreased knowledge of precautions  ?Plan P: Continue with AA/ROM, add standing proximal shoulder girdle strengthening. Follow up on MD  appointment.   ?Consulted and Agree with Plan of Care Patient  ? ? ?Calley Drenning OT, MOT ? ? ?Larey Seat, OTR/L  ?[phone] ?12/16/2021, 2:01 PM ? ?  ? ? ? ?

## 2021-12-18 ENCOUNTER — Encounter (HOSPITAL_COMMUNITY): Payer: Federal, State, Local not specified - PPO | Admitting: Occupational Therapy

## 2021-12-23 ENCOUNTER — Encounter (HOSPITAL_COMMUNITY): Payer: Self-pay | Admitting: Occupational Therapy

## 2021-12-23 ENCOUNTER — Ambulatory Visit (HOSPITAL_COMMUNITY): Payer: Federal, State, Local not specified - PPO | Admitting: Occupational Therapy

## 2021-12-23 DIAGNOSIS — R29898 Other symptoms and signs involving the musculoskeletal system: Secondary | ICD-10-CM

## 2021-12-23 DIAGNOSIS — M25512 Pain in left shoulder: Secondary | ICD-10-CM | POA: Diagnosis not present

## 2021-12-23 DIAGNOSIS — M25612 Stiffness of left shoulder, not elsewhere classified: Secondary | ICD-10-CM

## 2021-12-23 NOTE — Therapy (Signed)
?OUTPATIENT OCCUPATIONAL THERAPY TREATMENT NOTE ? ? ?Patient Name: Jason Espinoza ?MRN: [id number] ?DOB:01-03-1951, 71 y.o., male ?Today's Date: 12/23/2021 ? ?PCP: Amiylah Anastos Andrea, MD ?REFERRING PROVIDER:Dr. Tania Ade ? ? OT End of Session - 12/23/21 1025   ? ? Visit Number 8   ? Number of Visits 24   ? Date for OT Re-Evaluation 02/10/22   ? Authorization Type [employer], no copay   ? Authorization Time Period 25 visit limit combined PT/OT/ST; 1 used   ? Authorization - Visit Number 9   ? Authorization - Number of Visits 25   ? OT Start Time [phone]   ? OT Stop Time 1028   ? OT Time Calculation (min) 38 min   ? Activity Tolerance Patient tolerated treatment well   ? Behavior During Therapy [facility] for tasks assessed/performed   ? ?  ?  ? ?  ? ? ? ? ? ? ? ? ?History reviewed. No pertinent past medical history. ?History reviewed. No pertinent surgical history. ?Patient Active Problem List  ? Diagnosis Date Noted  ? OA (osteoarthritis) of shoulder 09/19/2021  ? Traumatic tear of supraspinatus tendon of left shoulder 08/19/2021  ? Full thickness tear of left subscapularis tendon 08/19/2021  ? ? ?ONSET DATE: 10/28/2021 ? ?REFERRING DIAG: S/P left shoulder arthroscopic RCR with subscapularis repair, SAD, biceps tenotomy ? ?THERAPY DIAG:  ?Acute pain of left shoulder ? ?Other symptoms and signs involving the musculoskeletal system ? ?Stiffness of left shoulder, not elsewhere classified ? ? ?PERTINENT HISTORY: Pt is a 71 y/o male S/P left shoulder arthroscopy with subscapularis repair, SAD, and biceps tenotomy completed on 10/28/21. ? ?PRECAUTIONS: shoulder. See protocol. No er for 6 weeks ? ?SUBJECTIVE: S: I can actually sleep on this shoulder now.  ? ?PAIN:  ?Are you having pain? No ?Pain location: ?Pain description:  ?Aggravating factors:  ?Relieving factors:  ? ? ? ? ?OBJECTIVE: From Evaluation:  ? ?UE ROM    ?  ?Passive ROM Left ?11/18/2021 Left (taken supine) ?12/16/2021   ?Shoulder flexion 80 147  ?Shoulder abduction 85 154   ?Shoulder internal rotation 90 90  ?Shoulder external rotation Not completed due to protocol 58  ?(Blank rows = not tested) ?  ?A/ROM not completed due to protocol. ?Active ROM Left ?11/18/2021 Left (seated adducted shoulder) ?12/16/2021  ?Shoulder flexion   110  ?Shoulder abduction   63  ?Shoulder internal rotation   90  ?Shoulder external rotation   60  ?(Blank rows = not tested) ?  ?  ?  ?UE MMT:    ?Strength not assessed due to protocol. ?MMT Left ?11/18/2021  ?Shoulder flexion    ?Shoulder abduction    ?Shoulder internal rotation    ?Shoulder external rotation    ?(Blank rows = not tested) ? ? ?TODAY'S TREATMENT: ? 12/23/21 ? -P/ROM: flexion, abduction, horizontal abduction, er/IR, 5X each ? -A/ROM: shoulder protraction, flexion, horizontal abduction, er/IR, 10X each supine; abduction AA/ROM 10X ? -AA/ROM: shoulder protraction, flexion, abduction, er/IR, 15X each standing ? -Red theraband scapular strengthening: row, extension, retraction, 10X each ? -UBE: Level 1, 3' forward 3' reverse, pace: 6.5 ?   ? ? ?12/16/21 ?-Myofascial release: completed separately from therapeutic exercises. Myofascial release to left upper arm, shoulder, trapezius, and scapular regions to decrease pain and fascial restrictions and increase joint ROM ? - Proximal shoulder girdle strength x10 reps without rest break.  ?  -AA/ROM: shoulder protraction, flexion, horizontal abduction, er/IR, 10X each supine  ?  -AA/ROM: shoulder protraction, flexion, abduction, er/IR,  10X each standing ? ?12/04/21 ?-Myofascial release: completed separately from therapeutic exercises. Myofascial release to left upper arm, shoulder, trapezius, and scapular regions to decrease pain and fascial restrictions and increase joint ROM ?-P/ROM: shoulder flexion, horizontal abduction, abduction, 10X each ?  -AA/ROM: shoulder protraction, flexion, horizontal abduction, er/IR, 10X each supine  ?  -AA/ROM: shoulder protraction, flexion, abduction, er/IR, 10X each  standing ? ? ? ? ? ? ? ?   ?OT Education    ?  ?  Education Details   ?  Person(s) Educated   ?  Methods   ?  Comprehension   ? ?HOME EXERCISE PROGRAM: ?Eval: table slides: no er per protocol. A/ROM wrist and elbow. 3/27: scapular A/ROM; 4/4: AA/ROM in supine ? ? ?SHORT TERM GOALS: Target date: 12/30/2021 ?  ?Patient will increase left UE P/ROM to [facility] in order to increase ability to complete dressing tasks with less difficulty. ?  ?Goal status: Ongoing ?  ?2.  Patient will increase LUE strength to 3/5 in order to complete reaching tasks to at least shoulder level with more shoulder stability and endurance.  ?  ?Goal status: Ongoing ?  ?3.  Patient will reports a pain level of approximately 5/10 or less when using his LUE to complete basic ADL tasks at home.  ?  ?4/18: Pt reports 3/10 pain during functional reaching ADL tasks.  ?Goal status: met  ?  ?4.  Patient will be educated and independent with HEP in order to facilitate his progress in therapy and work towards using his LUE for all daily and leisure tasks.  ?  ?Goal status:Ongoing ?  ?5.  Patient will decrease LUE fascial restrictions to Moderate amount in order to increase the functional mobility needed to complete mid level reaching tasks.  ? ? 4/18:Pt fascial restriction are moderate at this time mostly in the upper trapezius region.  ?Goal status: met ?  ?  ?  ?LONG TERM GOALS: Target date: 02/10/2022 ?  ?Patient will increase left UE A/ROM to [facility] in order to increase ability to complete dressing tasks and functional reaching tasks with less difficulty. ?  ?Goal status: Ongoing ?  ?2.  Patient will increase LUE strength to 4+/5 in order to complete gardening activities with more shoulder stability and endurance.  ?  ?Goal status: Ongoing ?  ?3.  Patient will reports a pain level of approximately 2/10 or less when using his LUE to complete basic ADL tasks at home.  ?  ?Goal status: Ongoing ?  ?4.   Patient will decrease LUE fascial restrictions to Minimal  amount in order to increase the functional mobility needed to complete high level reaching tasks.  ?  ?Goal status: Ongoing ?  ? ? ? ?   ?OT Assessment and Plan  ?Clinical Impression Statement A: Pt reports MD is pleased with his progress. Pt is having little to no pain and is able to sleep on his left side now. Continued with passive stretching, progressed to A/ROM in supine with exception of abduction. Increased standing repetitions to 15, pt with mod effort for flexion and abduction. Added scapular theraband and UBE. Verbal cuing for form and technique and speed during exercises.   ?Pt will benefit from skilled therapeutic intervention in order to improve on the following performance deficits Body Structure / Function / Physical Skills  ?Body Structure / Function / Physical Skills ADL;Endurance;Muscle spasms;UE functional use;Fascial restriction;Pain;ROM;IADL;Strength;Mobility;Decreased knowledge of precautions  ?Plan P: Continue working to progress to A/ROM, continue with scapular  theraband. Focus on form with all exercises  ?Consulted and Agree with Plan of Care Patient  ? ? ?Guadelupe Sabin, OTR/L  ?[phone] ?12/23/2021, 10:28 AM ? ?  ? ? ? ?

## 2021-12-30 ENCOUNTER — Encounter (HOSPITAL_COMMUNITY): Payer: Self-pay | Admitting: Occupational Therapy

## 2021-12-30 ENCOUNTER — Ambulatory Visit (HOSPITAL_COMMUNITY): Payer: Federal, State, Local not specified - PPO | Attending: Surgical | Admitting: Occupational Therapy

## 2021-12-30 DIAGNOSIS — M25612 Stiffness of left shoulder, not elsewhere classified: Secondary | ICD-10-CM | POA: Diagnosis present

## 2021-12-30 DIAGNOSIS — R29898 Other symptoms and signs involving the musculoskeletal system: Secondary | ICD-10-CM

## 2021-12-30 DIAGNOSIS — M25512 Pain in left shoulder: Secondary | ICD-10-CM | POA: Diagnosis present

## 2021-12-30 NOTE — Therapy (Signed)
?OUTPATIENT OCCUPATIONAL THERAPY TREATMENT NOTE ? ? ?Patient Name: Jason Espinoza ?MRN: [id number] ?DOB:[birth year], 71 y.o., male ?Today's Date: 12/30/2021 ? ?PCP: Isobella Ascher Andrea, MD ?REFERRING PROVIDER:Dr. Tania Ade ? ? OT End of Session - 12/30/21 1026   ? ? Visit Number 9   ? Number of Visits 24   ? Date for OT Re-Evaluation 02/10/22   ? Authorization Type [employer], no copay   ? Authorization Time Period 25 visit limit combined PT/OT/ST; 1 used   ? Authorization - Visit Number 10   ? Authorization - Number of Visits 25   ? OT Start Time [phone]   ? OT Stop Time 1028   ? OT Time Calculation (min) 39 min   ? Activity Tolerance Patient tolerated treatment well   ? Behavior During Therapy [facility] for tasks assessed/performed   ? ?  ?  ? ?  ? ? ? ? ? ? ? ? ? ?History reviewed. No pertinent past medical history. ?History reviewed. No pertinent surgical history. ?Patient Active Problem List  ? Diagnosis Date Noted  ? OA (osteoarthritis) of shoulder 09/19/2021  ? Traumatic tear of supraspinatus tendon of left shoulder 08/19/2021  ? Full thickness tear of left subscapularis tendon 08/19/2021  ? ? ?ONSET DATE: 10/28/2021 ? ?REFERRING DIAG: S/P left shoulder arthroscopic RCR with subscapularis repair, SAD, biceps tenotomy ? ?THERAPY DIAG:  ?Acute pain of left shoulder ? ?Other symptoms and signs involving the musculoskeletal system ? ?Stiffness of left shoulder, not elsewhere classified ? ? ?PERTINENT HISTORY: Pt is a 71 y/o male S/P left shoulder arthroscopy with subscapularis repair, SAD, and biceps tenotomy completed on 10/28/21. ? ?PRECAUTIONS: shoulder. See protocol. No er for 6 weeks ? ?SUBJECTIVE: S: It's feeling pretty good actually.  ?PAIN:  ?Are you having pain? No ?Pain location: ?Pain description:  ?Aggravating factors:  ?Relieving factors:  ? ? ? ? ?OBJECTIVE: From Evaluation:  ? ?UE ROM    ?  ?Passive ROM Left ?11/18/2021 Left (taken supine) ?12/16/2021   ?Shoulder flexion 80 147  ?Shoulder abduction 85 154  ?Shoulder  internal rotation 90 90  ?Shoulder external rotation Not completed due to protocol 58  ?(Blank rows = not tested) ?  ?A/ROM not completed due to protocol. ?Active ROM Left ?11/18/2021 Left (seated adducted shoulder) ?12/16/2021  ?Shoulder flexion   110  ?Shoulder abduction   63  ?Shoulder internal rotation   90  ?Shoulder external rotation   60  ?(Blank rows = not tested) ?  ?  ?  ?UE MMT:    ?Strength not assessed due to protocol. ?MMT Left ?11/18/2021  ?Shoulder flexion    ?Shoulder abduction    ?Shoulder internal rotation    ?Shoulder external rotation    ?(Blank rows = not tested) ? ? ?TODAY'S TREATMENT: ? 12/30/2021 ? -P/ROM: flexion, abduction, horizontal abduction, er/IR, 5X each ? -A/ROM supine: shoulder protraction, flexion, horizontal abduction, er/IR, 10X each, abduction AA/ROM 10X ? -Proximal shoulder strengthening in supine: 10X each, no rest breaks ? -A/ROM standing: protraction, flexion, er/IR, horizontal abduction, 10X ? -AA/ROM standing: abduction, 10X ? -Red theraband scapular strengthening: row, extension, retraction, 10X each ? -UBE: Level 1, 3' forward 3' reverse, pace: 6.5 ? ? ?12/23/21 ? -P/ROM: flexion, abduction, horizontal abduction, er/IR, 5X each ? -A/ROM: shoulder protraction, flexion, horizontal abduction, er/IR, 10X each supine; abduction AA/ROM 10X ? -AA/ROM: shoulder protraction, flexion, abduction, er/IR, 15X each standing ? -Red theraband scapular strengthening: row, extension, retraction, 10X each ? -UBE: Level 1, 3' forward 3' reverse, pace:  6.5 ?   ? ? ?12/16/21 ?-Myofascial release: completed separately from therapeutic exercises. Myofascial release to left upper arm, shoulder, trapezius, and scapular regions to decrease pain and fascial restrictions and increase joint ROM ? - Proximal shoulder girdle strength x10 reps without rest break.  ?  -AA/ROM: shoulder protraction, flexion, horizontal abduction, er/IR, 10X each supine  ?  -AA/ROM: shoulder protraction, flexion, abduction,  er/IR, 10X each standing ? ? ? ? ?   ?OT Education    ?  ?  Education Details A/ROM in supine  ?  Person(s) Educated patient  ?  Methods Verbal explanation, demonstration  ?  Comprehension Verbalized understanding, returned demonstration  ? ?HOME EXERCISE PROGRAM: ?Eval: table slides: no er per protocol. A/ROM wrist and elbow. 3/27: scapular A/ROM; 4/4: AA/ROM in supine; 5/2: A/ROM in supine ? ? ?SHORT TERM GOALS: Target date: 12/30/2021 ?  ?Patient will increase left UE P/ROM to [facility] in order to increase ability to complete dressing tasks with less difficulty. ?  ?Goal status: Ongoing ?  ?2.  Patient will increase LUE strength to 3/5 in order to complete reaching tasks to at least shoulder level with more shoulder stability and endurance.  ?  ?Goal status: Ongoing ?  ?3.  Patient will reports a pain level of approximately 5/10 or less when using his LUE to complete basic ADL tasks at home.  ?  ?4/18: Pt reports 3/10 pain during functional reaching ADL tasks.  ?Goal status: met  ?  ?4.  Patient will be educated and independent with HEP in order to facilitate his progress in therapy and work towards using his LUE for all daily and leisure tasks.  ?  ?Goal status:Ongoing ?  ?5.  Patient will decrease LUE fascial restrictions to Moderate amount in order to increase the functional mobility needed to complete mid level reaching tasks.  ? ? 4/18:Pt fascial restriction are moderate at this time mostly in the upper trapezius region.  ?Goal status: met ?  ?  ?  ?LONG TERM GOALS: Target date: 02/10/2022 ?  ?Patient will increase left UE A/ROM to [facility] in order to increase ability to complete dressing tasks and functional reaching tasks with less difficulty. ?  ?Goal status: Ongoing ?  ?2.  Patient will increase LUE strength to 4+/5 in order to complete gardening activities with more shoulder stability and endurance.  ?  ?Goal status: Ongoing ?  ?3.  Patient will reports a pain level of approximately 2/10 or less when using his LUE  to complete basic ADL tasks at home.  ?  ?Goal status: Ongoing ?  ?4.   Patient will decrease LUE fascial restrictions to Minimal amount in order to increase the functional mobility needed to complete high level reaching tasks.  ?  ?Goal status: Ongoing ?  ? ? ? ?   ?OT Assessment and Plan  ?Clinical Impression Statement A: Pt reports fatigue after previous session, but has noticed that he can lift his arm more recently. Continued with P/ROM and A/ROM in supine. Added proximal shoulder strengthening in supine today. Pt able to progress to A/ROM in standing with exception of abduction. Continued with scapular theraband for scapular stability and strengthening with improved form. Verbal cuing for form and technique and speed during exercises.   ?Pt will benefit from skilled therapeutic intervention in order to improve on the following performance deficits Body Structure / Function / Physical Skills  ?Body Structure / Function / Physical Skills ADL;Endurance;Muscle spasms;UE functional use;Fascial restriction;Pain;ROM;IADL;Strength;Mobility;Decreased knowledge of  precautions  ?Plan P: Follow up on HEP, continue working towards A/ROM, add scapular theraband to HEP  ?Consulted and Agree with Plan of Care Patient  ? ? ?Guadelupe Sabin, OTR/L  ?[phone] ?12/30/2021, 10:29 AM ? ?  ? ? ? ?

## 2021-12-30 NOTE — Patient Instructions (Signed)
Repeat all exercises 10-15 times, 1-2 times per day. ? ?1) Shoulder Protraction ? ? ? ?Begin with elbows by your side, slowly "punch" straight out in front of you.   ? ? ? ?2) Shoulder Flexion ? ?Supine:     Standing:  ?      ? ?Begin with arms at your side with thumbs pointed up, slowly raise both arms up and forward towards overhead.  ? ? ? ? ? ? ? ? ? ? ? ? ? ?3) Horizontal abduction/adduction ? ?Supine: ? ? ?Standing: ? ?       ? ? ?Begin with arms straight out in front of you, bring out to the side in at "T" shape. Keep arms straight entire time.  ? ? ? ? ? ? ? ? ? ? ? ? ? ? ? ?4) Internal & External Rotation ? ? Supine:     Standing: ?   ? ?Stand with elbows at the side and elbows bent 90 degrees. Move your forearms away from your body, then bring back inward toward the body.   ? ? ? ? ?

## 2022-01-01 ENCOUNTER — Encounter (HOSPITAL_COMMUNITY): Payer: Self-pay | Admitting: Occupational Therapy

## 2022-01-01 ENCOUNTER — Ambulatory Visit (HOSPITAL_COMMUNITY): Payer: Federal, State, Local not specified - PPO | Admitting: Occupational Therapy

## 2022-01-01 DIAGNOSIS — R29898 Other symptoms and signs involving the musculoskeletal system: Secondary | ICD-10-CM

## 2022-01-01 DIAGNOSIS — M25612 Stiffness of left shoulder, not elsewhere classified: Secondary | ICD-10-CM

## 2022-01-01 DIAGNOSIS — M25512 Pain in left shoulder: Secondary | ICD-10-CM

## 2022-01-01 NOTE — Therapy (Signed)
?OUTPATIENT OCCUPATIONAL THERAPY TREATMENT NOTE ? ? ?Patient Name: Jason Espinoza ?MRN: [id number] ?DOB:[birth year], 71 y.o., male ?Today's Date: 01/01/2022 ? ?PCP: Shaylah Mcghie Andrea, MD ?REFERRING PROVIDER:Dr. Tania Ade ? ? OT End of Session - 01/01/22 1425   ? ? Visit Number 10   ? Number of Visits 24   ? Date for OT Re-Evaluation 02/10/22   ? Authorization Type [employer], no copay   ? Authorization Time Period 25 visit limit combined PT/OT/ST; 1 used   ? Authorization - Visit Number 10   ? Authorization - Number of Visits 25   ? OT Start Time 1350   ? OT Stop Time 1429   ? OT Time Calculation (min) 39 min   ? Activity Tolerance Patient tolerated treatment well   ? Behavior During Therapy [facility] for tasks assessed/performed   ? ?  ?  ? ?  ? ? ? ? ? ? ? ? ? ? ?History reviewed. No pertinent past medical history. ?History reviewed. No pertinent surgical history. ?Patient Active Problem List  ? Diagnosis Date Noted  ? OA (osteoarthritis) of shoulder 09/19/2021  ? Traumatic tear of supraspinatus tendon of left shoulder 08/19/2021  ? Full thickness tear of left subscapularis tendon 08/19/2021  ? ? ?ONSET DATE: 10/28/2021 ? ?REFERRING DIAG: S/P left shoulder arthroscopic RCR with subscapularis repair, SAD, biceps tenotomy ? ?THERAPY DIAG:  ?Acute pain of left shoulder ? ?Other symptoms and signs involving the musculoskeletal system ? ?Stiffness of left shoulder, not elsewhere classified ? ? ?PERTINENT HISTORY: Pt is a 71 y/o male S/P left shoulder arthroscopy with subscapularis repair, SAD, and biceps tenotomy completed on 10/28/21. ? ?PRECAUTIONS: shoulder. See protocol. No er for 6 weeks ? ?SUBJECTIVE: S: It's feeling pretty good actually.  ?PAIN:  ?Are you having pain? No ?Pain location: ?Pain description:  ?Aggravating factors:  ?Relieving factors:  ? ? ? ? ?OBJECTIVE: From Evaluation:  ? ?UE ROM    ?  ?Passive ROM Left ?11/18/2021 Left (taken supine) ?12/16/2021   ?Shoulder flexion 80 147  ?Shoulder abduction 85 154   ?Shoulder internal rotation 90 90  ?Shoulder external rotation Not completed due to protocol 58  ?(Blank rows = not tested) ?  ?A/ROM not completed due to protocol. ?Active ROM Left ?11/18/2021 Left (seated adducted shoulder) ?12/16/2021  ?Shoulder flexion   110  ?Shoulder abduction   63  ?Shoulder internal rotation   90  ?Shoulder external rotation   60  ?(Blank rows = not tested) ?  ?  ?  ?UE MMT:    ?Strength not assessed due to protocol. ?MMT Left ?11/18/2021  ?Shoulder flexion    ?Shoulder abduction    ?Shoulder internal rotation    ?Shoulder external rotation    ?(Blank rows = not tested) ? ? ?TODAY'S TREATMENT: ? 01/01/2022 ? -P/ROM: flexion, abduction, horizontal abduction, er/IR, 5X each ? -A/ROM supine: shoulder protraction, flexion, horizontal abduction, er/IR, 12X each, abduction AA/ROM 10X ? -Proximal shoulder strengthening in supine: 10X each, no rest breaks ? -A/ROM standing: protraction, flexion, er/IR, 10X ? -AA/ROM standing: abduction, 10X ?-Green theraband scapular strengthening: row, extension, retraction, 10X each ?-UBE: Level 1, 3' forward 3' reverse, pace: 6.0 ? ? ?12/30/2021 ? -P/ROM: flexion, abduction, horizontal abduction, er/IR, 5X each ? -A/ROM supine: shoulder protraction, flexion, horizontal abduction, er/IR, 10X each, abduction AA/ROM 10X ? -Proximal shoulder strengthening in supine: 10X each, no rest breaks ? -A/ROM standing: protraction, flexion, er/IR, horizontal abduction, 10X ? -AA/ROM standing: abduction, 10X ? -Red theraband scapular strengthening: row, extension,  retraction, 10X each ? -UBE: Level 1, 3' forward 3' reverse, pace: 6.5 ? ? ?12/23/21 ? -P/ROM: flexion, abduction, horizontal abduction, er/IR, 5X each ? -A/ROM: shoulder protraction, flexion, horizontal abduction, er/IR, 10X each supine; abduction AA/ROM 10X ? -AA/ROM: shoulder protraction, flexion, abduction, er/IR, 15X each standing ? -Red theraband scapular strengthening: row, extension, retraction, 10X each ? -UBE:  Level 1, 3' forward 3' reverse, pace: 6.5 ?   ? ? ?   ?OT Education    ?  ?  Education Details Scapular theraband-red  ?  Person(s) Educated patient  ?  Methods Verbal explanation, demonstration  ?  Comprehension Verbalized understanding, returned demonstration  ? ?HOME EXERCISE PROGRAM: ?Eval: table slides: no er per protocol. A/ROM wrist and elbow. 3/27: scapular A/ROM; 4/4: AA/ROM in supine; 5/2: A/ROM in supine; 5/4: red scapular theraband exercises ? ? ?SHORT TERM GOALS: Target date: 12/30/2021 ?  ?Patient will increase left UE P/ROM to [facility] in order to increase ability to complete dressing tasks with less difficulty. ?  ?Goal status: Ongoing ?  ?2.  Patient will increase LUE strength to 3/5 in order to complete reaching tasks to at least shoulder level with more shoulder stability and endurance.  ?  ?Goal status: Ongoing ?  ?3.  Patient will reports a pain level of approximately 5/10 or less when using his LUE to complete basic ADL tasks at home.  ?  ?4/18: Pt reports 3/10 pain during functional reaching ADL tasks.  ?Goal status: met  ?  ?4.  Patient will be educated and independent with HEP in order to facilitate his progress in therapy and work towards using his LUE for all daily and leisure tasks.  ?  ?Goal status:Ongoing ?  ?5.  Patient will decrease LUE fascial restrictions to Moderate amount in order to increase the functional mobility needed to complete mid level reaching tasks.  ? ? 4/18:Pt fascial restriction are moderate at this time mostly in the upper trapezius region.  ?Goal status: met ?  ?  ?  ?LONG TERM GOALS: Target date: 02/10/2022 ?  ?Patient will increase left UE A/ROM to [facility] in order to increase ability to complete dressing tasks and functional reaching tasks with less difficulty. ?  ?Goal status: Ongoing ?  ?2.  Patient will increase LUE strength to 4+/5 in order to complete gardening activities with more shoulder stability and endurance.  ?  ?Goal status: Ongoing ?  ?3.  Patient will reports  a pain level of approximately 2/10 or less when using his LUE to complete basic ADL tasks at home.  ?  ?Goal status: Ongoing ?  ?4.   Patient will decrease LUE fascial restrictions to Minimal amount in order to increase the functional mobility needed to complete high level reaching tasks.  ?  ?Goal status: Ongoing ?  ? ? ? ?   ?OT Assessment and Plan  ?Clinical Impression Statement A: Pt reports less fatigue after Tuesdays session than in previous session.  Pt demonstrates progress with abduction, however continues to require dowel rod for abduction. Pt with mod fatigue during exercises, focusing on form and control. Cuing for depressing trapezius during standing tasks. Updated HEP for green theraband. Verbal cuing for form and technique and speed during exercises.   ?Pt will benefit from skilled therapeutic intervention in order to improve on the following performance deficits Body Structure / Function / Physical Skills  ?Body Structure / Function / Physical Skills ADL;Endurance;Muscle spasms;UE functional use;Fascial restriction;Pain;ROM;IADL;Strength;Mobility;Decreased knowledge of precautions  ?Plan P:  Follow up on HEP, continue working towards A/ROM with abduction and horizontal abduction  ?Consulted and Agree with Plan of Care Patient  ? ? ?Guadelupe Sabin, OTR/L  ?[phone] ?01/01/2022, 2:29 PM ? ?  ? ? ? ?

## 2022-01-01 NOTE — Patient Instructions (Signed)

## 2022-01-06 ENCOUNTER — Encounter (HOSPITAL_COMMUNITY): Payer: Self-pay

## 2022-01-06 ENCOUNTER — Ambulatory Visit (HOSPITAL_COMMUNITY): Payer: Federal, State, Local not specified - PPO

## 2022-01-06 DIAGNOSIS — M25512 Pain in left shoulder: Secondary | ICD-10-CM

## 2022-01-06 DIAGNOSIS — M25612 Stiffness of left shoulder, not elsewhere classified: Secondary | ICD-10-CM

## 2022-01-06 DIAGNOSIS — R29898 Other symptoms and signs involving the musculoskeletal system: Secondary | ICD-10-CM

## 2022-01-06 NOTE — Therapy (Signed)
?OUTPATIENT OCCUPATIONAL THERAPY TREATMENT NOTE ? ? ?Patient Name: Jason Espinoza ?MRN: [id number] ?DOB:1951-03-29, 71 y.o., male ?Today's Date: 01/06/2022 ? ?PCP: Leslie Andrea, MD ?REFERRING PROVIDER:Dr. Tania Ade, MD ? ? OT End of Session - 01/06/22 1039   ? ? Visit Number 11   ? Number of Visits 24   ? Date for OT Re-Evaluation 02/10/22   ? Authorization Type [employer], no copay   ? Authorization Time Period 25 visit limit combined PT/OT/ST; 1 used   ? Authorization - Visit Number 11   ? Authorization - Number of Visits 25   ? OT Start Time [phone]   arrived late  ? OT Stop Time 1028   ? OT Time Calculation (min) 38 min   ? Activity Tolerance Patient tolerated treatment well   ? Behavior During Therapy [facility] for tasks assessed/performed   ? ?  ?  ? ?  ? ? ? ? ? ? ? ? ? ? ? ?History reviewed. No pertinent past medical history. ?History reviewed. No pertinent surgical history. ?Patient Active Problem List  ? Diagnosis Date Noted  ? OA (osteoarthritis) of shoulder 09/19/2021  ? Traumatic tear of supraspinatus tendon of left shoulder 08/19/2021  ? Full thickness tear of left subscapularis tendon 08/19/2021  ? ? ?ONSET DATE: 10/28/2021 ? ?REFERRING DIAG: S/P left shoulder arthroscopic RCR with subscapularis repair, SAD, biceps tenotomy ? ?THERAPY DIAG:  ?Other symptoms and signs involving the musculoskeletal system ? ?Acute pain of left shoulder ? ?Stiffness of left shoulder, not elsewhere classified ? ? ?PERTINENT HISTORY: Pt is a 71 y/o male S/P left shoulder arthroscopy with subscapularis repair, SAD, and biceps tenotomy completed on 10/28/21. ? ?PRECAUTIONS: shoulder. See protocol. OK to complete er now. ? ?SUBJECTIVE: S: I wasn't able to do my exercises when I was in [place]. I had every intention to do them but I didn't.  ?PAIN:  ?Are you having pain? No ?Pain location: ?Pain description:  ?Aggravating factors:  ?Relieving factors:  ? ? ? ? ?OBJECTIVE: From Evaluation:  ? ?UE ROM    ?  ?Passive ROM Left ?11/18/2021 Left  (taken supine) ?12/16/2021   ?Shoulder flexion 80 147  ?Shoulder abduction 85 154  ?Shoulder internal rotation 90 90  ?Shoulder external rotation Not completed due to protocol 58  ?(Blank rows = not tested) ?  ?A/ROM not completed due to protocol. ?Active ROM Left ?11/18/2021 Left (seated adducted shoulder) ?12/16/2021  ?Shoulder flexion   110  ?Shoulder abduction   63  ?Shoulder internal rotation   90  ?Shoulder external rotation   60  ?(Blank rows = not tested) ?  ?  ?  ?UE MMT:    ?Strength not assessed due to protocol. ?MMT Left ?11/18/2021  ?Shoulder flexion    ?Shoulder abduction    ?Shoulder internal rotation    ?Shoulder external rotation    ?(Blank rows = not tested) ? ? ?TODAY'S TREATMENT: ? 01/06/22 ? - P/ROM: supine, all shoulder ranges, 5X  ? - A/ROM: supine, shoulder, flexion, horizontal abduction/adduction, 12X ?- Proximal shoulder strengthening in supine: 12X each, no rest breaks ?- A/ROM: sidelying, shoulder, er, 12X using towel roll ?- A/ROM: prone, shoulder, horizontal abduction, retraction, 10X ?- A/ROM: standing lateral raises in scaption, flexion, abduction (all to shoulder level only) 10X ?- Strengthening: yellow band, IR , er (with towel roll), 10X ?*No UBE used per protocol. ? ? ? ?01/01/2022 ? -P/ROM: flexion, abduction, horizontal abduction, er/IR, 5X each ? -A/ROM supine: shoulder protraction, flexion, horizontal abduction, er/IR, 12X  each, abduction AA/ROM 10X ? -Proximal shoulder strengthening in supine: 10X each, no rest breaks ? -A/ROM standing: protraction, flexion, er/IR, 10X ? -AA/ROM standing: abduction, 10X ?-Green theraband scapular strengthening: row, extension, retraction, 10X each ?-UBE: Level 1, 3' forward 3' reverse, pace: 6.0 ? ? ?12/30/2021 ? -P/ROM: flexion, abduction, horizontal abduction, er/IR, 5X each ? -A/ROM supine: shoulder protraction, flexion, horizontal abduction, er/IR, 10X each, abduction AA/ROM 10X ? -Proximal shoulder strengthening in supine: 10X each, no rest  breaks ? -A/ROM standing: protraction, flexion, er/IR, horizontal abduction, 10X ? -AA/ROM standing: abduction, 10X ? -Red theraband scapular strengthening: row, extension, retraction, 10X each ? -UBE: Level 1, 3' forward 3' reverse, pace: 6.5 ? ? ? ?   ?OT Education    ?  ?  Education Details Reviewed week 10 post-op precautions and goals. Provided prone A/ROM exercises, yellow band for IR/er.  ?  Person(s) Educated Patient  ?  Methods verbalized  ?  Comprehension Verbalized understanding  ? ?HOME EXERCISE PROGRAM: ?Eval: table slides: no er per protocol. A/ROM wrist and elbow. 3/27: scapular A/ROM; 4/4: AA/ROM in supine; 5/2: A/ROM in supine; 5/4: red scapular theraband exercises 5/9: yellow band: IR/er, prone A/ROM retraction, horizontal abduction ? ? ?SHORT TERM GOALS: Target date: 12/30/2021 ?  ?Patient will increase left UE P/ROM to [facility] in order to increase ability to complete dressing tasks with less difficulty. ?  ?Goal status: Ongoing ?  ?2.  Patient will increase LUE strength to 3/5 in order to complete reaching tasks to at least shoulder level with more shoulder stability and endurance.  ?  ?Goal status: Ongoing ?  ?3.  Patient will reports a pain level of approximately 5/10 or less when using his LUE to complete basic ADL tasks at home.  ?  ?4/18: Pt reports 3/10 pain during functional reaching ADL tasks.  ?Goal status: met  ?  ?4.  Patient will be educated and independent with HEP in order to facilitate his progress in therapy and work towards using his LUE for all daily and leisure tasks.  ?  ?Goal status:Ongoing ?  ?5.  Patient will decrease LUE fascial restrictions to Moderate amount in order to increase the functional mobility needed to complete mid level reaching tasks.  ? ? 4/18:Pt fascial restriction are moderate at this time mostly in the upper trapezius region.  ?Goal status: met ?  ?  ?  ?LONG TERM GOALS: Target date: 02/10/2022 ?  ?Patient will increase left UE A/ROM to [facility] in order to increase  ability to complete dressing tasks and functional reaching tasks with less difficulty. ?  ?Goal status: Ongoing ?  ?2.  Patient will increase LUE strength to 4+/5 in order to complete gardening activities with more shoulder stability and endurance.  ?  ?Goal status: Ongoing ?  ?3.  Patient will reports a pain level of approximately 2/10 or less when using his LUE to complete basic ADL tasks at home.  ?  ?Goal status: Ongoing ?  ?4.   Patient will decrease LUE fascial restrictions to Minimal amount in order to increase the functional mobility needed to complete high level reaching tasks.  ?  ?Goal status: Ongoing ?  ? ? ? ?   ?OT Assessment and Plan  ?Clinical Impression Statement A: Unable to due new HEP during weekend away. Now at week 10 post-op. Ok to complete er per MD. Focused on gentle strengthening using yellow band and shoulder stability and strength during A/ROM standing, prone, and sidelying. VC for  form and technique provided.  ?Pt will benefit from skilled therapeutic intervention in order to improve on the following performance deficits Body Structure / Function / Physical Skills  ?Body Structure / Function / Physical Skills ADL;Endurance;Muscle spasms;UE functional use;Fascial restriction;Pain;ROM;IADL;Strength;Mobility;Decreased knowledge of precautions  ?Plan P: Follow up on HEP. Continue with Week 10 protocol. Scapular strengthening, A/ROM progression. Shoulder stretches  ?Consulted and Agree with Plan of Care Patient  ? ? ?Ailene Ravel, OTR/L,CBIS  ?[phone] ? ?01/06/2022, 10:43 AM ? ?  ? ? ? ?

## 2022-01-06 NOTE — Patient Instructions (Signed)
Complete the following once a day. 10 times each. ? ? ?PRONE SHOULDER  HORIZONTAL ABDUCTION ? ?Lie face down with your arm dangling over the side of the table/bed.  ? ?Next, squeeze your shoulder blades inward and downwards towards your spine. Start movement by raising your arm upward towards the ceiling while keeping your arm 90 degrees from your side.  (Hand position: palm should be facing the ground at raised position) ? ?Return to starting position and repeat. ? ? ? ?Prone Row ? ?Lie on your stomach with the involved arm hanging freely from the bed/mat. Letting the elbow bend, lift the elbow toward the ceiling. Keep the upper arm perpendicular to the body.  ? ? ? ? ?ELASTIC BAND SHOULDER EXTERNAL ROTATION - ER (with yellow band) ? ?While holding an elastic band at your side with your elbow bent, start with your hand near your stomach and then pull the band away. Keep your elbow at your side the entire time. (Hand position: thumbs up)  ? ? ? ?ELASTIC BAND SHOULDER INTERNAL ROTATION - IR (Could use green band if you want) ? ?While holding an elastic band at your side with your elbow bent, start with your hand away from your stomach, then pull the band towards your stomach. Keep your elbow near your side the entire time. ?(No towel roll needed) ? ? ? ?

## 2022-01-08 ENCOUNTER — Ambulatory Visit (HOSPITAL_COMMUNITY): Payer: Federal, State, Local not specified - PPO

## 2022-01-08 ENCOUNTER — Telehealth (HOSPITAL_COMMUNITY): Payer: Self-pay

## 2022-01-08 ENCOUNTER — Encounter (HOSPITAL_COMMUNITY): Payer: Self-pay

## 2022-01-08 DIAGNOSIS — R29898 Other symptoms and signs involving the musculoskeletal system: Secondary | ICD-10-CM

## 2022-01-08 DIAGNOSIS — M25512 Pain in left shoulder: Secondary | ICD-10-CM | POA: Diagnosis not present

## 2022-01-08 DIAGNOSIS — M25612 Stiffness of left shoulder, not elsewhere classified: Secondary | ICD-10-CM

## 2022-01-08 NOTE — Telephone Encounter (Signed)
L/m to let  patient know 6/1; and 6/15  has been cx LE will out of the office.  ?

## 2022-01-08 NOTE — Therapy (Signed)
?OUTPATIENT OCCUPATIONAL THERAPY TREATMENT NOTE ? ? ?Patient Name: Jason Espinoza ?MRN: [id number] ?DOB:08/05/1951, 71 y.o., male ?Today's Date: 01/08/2022 ? ?PCP: Leslie Andrea, MD ?REFERRING PROVIDER:Dr. Tania Ade, MD ? ? OT End of Session - 01/08/22 1017   ? ? Visit Number 12   ? Number of Visits 24   ? Date for OT Re-Evaluation 02/10/22   ? Authorization Type [employer], no copay   ? Authorization Time Period 25 visit limit combined PT/OT/ST; 1 used   ? Authorization - Visit Number 12   ? Authorization - Number of Visits 25   ? OT Start Time [phone]   ? OT Stop Time 1023   ? OT Time Calculation (min) 33 min   ? Activity Tolerance Patient tolerated treatment well   ? Behavior During Therapy [facility] for tasks assessed/performed   ? ?  ?  ? ?  ? ? ? ? ? ? ? ? ? ? ? ? ?History reviewed. No pertinent past medical history. ?History reviewed. No pertinent surgical history. ?Patient Active Problem List  ? Diagnosis Date Noted  ? OA (osteoarthritis) of shoulder 09/19/2021  ? Traumatic tear of supraspinatus tendon of left shoulder 08/19/2021  ? Full thickness tear of left subscapularis tendon 08/19/2021  ? ? ?ONSET DATE: 10/28/2021 ? ?REFERRING DIAG: S/P left shoulder arthroscopic RCR with subscapularis repair, SAD, biceps tenotomy ? ?THERAPY DIAG:  ?Other symptoms and signs involving the musculoskeletal system ? ?Acute pain of left shoulder ? ?Stiffness of left shoulder, not elsewhere classified ? ? ?PERTINENT HISTORY: Pt is a 71 y/o male S/P left shoulder arthroscopy with subscapularis repair, SAD, and biceps tenotomy completed on 10/28/21.  ?02/02/22: follow up appointment with Dr. Tamera Punt ? ?PRECAUTIONS: shoulder. See protocol. OK to complete er now. ? ?SUBJECTIVE: S: I used ice after last session like you suggested and I'm going to do again. ?PAIN:  ?Are you having pain? No ?Pain location: ?Pain description:  ?Aggravating factors:  ?Relieving factors:  ? ? ? ? ?OBJECTIVE: From Evaluation:  ? ?UE ROM    ?  ?Passive ROM  Left ?11/18/2021 Left (taken supine) ?12/16/2021   ?Shoulder flexion 80 147  ?Shoulder abduction 85 154  ?Shoulder internal rotation 90 90  ?Shoulder external rotation Not completed due to protocol 58  ?(Blank rows = not tested) ?  ?A/ROM not completed due to protocol. ?Active ROM Left ?11/18/2021 Left (seated adducted shoulder) ?12/16/2021  ?Shoulder flexion   110  ?Shoulder abduction   63  ?Shoulder internal rotation   90  ?Shoulder external rotation   60  ?(Blank rows = not tested) ?  ?  ?  ?UE MMT:    ?Strength not assessed due to protocol. ?MMT Left ?11/18/2021  ?Shoulder flexion    ?Shoulder abduction    ?Shoulder internal rotation    ?Shoulder external rotation    ?(Blank rows = not tested) ? ? ?TODAY'S TREATMENT: ? 01/08/22 ?  - P/ROM: supine, all shoulder ranges, 5X  ? - A/ROM: supine, shoulder, flexion, horizontal abduction/adduction, 12X ? - Proximal shoulder strengthening in supine: 12X each, no rest breaks ? - A/ROM: sidelying, shoulder, er, 12X using towel roll ? - A/ROM: prone, shoulder, horizontal abduction, retraction, 12X ? - A/ROM: standing lateral raises in scaption, flexion: 12X; abduction (all to shoulder level only) 10X ? - Stretching: shoulder flexion; wall 2x20" ? ? ?01/06/22 ? - P/ROM: supine, all shoulder ranges, 5X  ? - A/ROM: supine, shoulder, flexion, horizontal abduction/adduction, 12X ?- Proximal shoulder strengthening in supine: 12X  each, no rest breaks ?- A/ROM: sidelying, shoulder, er, 12X using towel roll ?- A/ROM: prone, shoulder, horizontal abduction, retraction, 10X ?- A/ROM: standing lateral raises in scaption, flexion, abduction (all to shoulder level only) 10X ?- Strengthening: yellow band, IR , er (with towel roll), 10X ?*No UBE used per protocol. ? ? ? ?01/01/2022 ? -P/ROM: flexion, abduction, horizontal abduction, er/IR, 5X each ? -A/ROM supine: shoulder protraction, flexion, horizontal abduction, er/IR, 12X each, abduction AA/ROM 10X ? -Proximal shoulder strengthening in supine:  10X each, no rest breaks ? -A/ROM standing: protraction, flexion, er/IR, 10X ? -AA/ROM standing: abduction, 10X ?-Green theraband scapular strengthening: row, extension, retraction, 10X each ?-UBE: Level 1, 3' forward 3' reverse, pace: 6.0 ? ? ? ?   ?OT Education    ?  ?  Education Details Reviewed HEP. Stop table slides, scapular seated A/ROM, AA/ROM. Focus on scapular strengthening with resistive bands, prone A/ROM. A/ROM may be optional as long as completing daily tasks at home that mimic the same motions.  ?  Person(s) Educated patient  ?  Methods verbal  ?  Comprehension Verbalized understanding  ? ?HOME EXERCISE PROGRAM: ?Eval: table slides: no er per protocol. A/ROM wrist and elbow. 3/27: scapular A/ROM; 4/4: AA/ROM in supine; 5/2: A/ROM in supine; 5/4: red scapular theraband exercises 5/9: yellow band: IR/er, prone A/ROM retraction, horizontal abduction ? ? ?SHORT TERM GOALS: Target date: 12/30/2021 ?  ?Patient will increase left UE P/ROM to [facility] in order to increase ability to complete dressing tasks with less difficulty. ?  ?Goal status: Ongoing ?  ?2.  Patient will increase LUE strength to 3/5 in order to complete reaching tasks to at least shoulder level with more shoulder stability and endurance.  ?  ?Goal status: Ongoing ?  ?3.  Patient will reports a pain level of approximately 5/10 or less when using his LUE to complete basic ADL tasks at home.  ?  ?4/18: Pt reports 3/10 pain during functional reaching ADL tasks.  ?Goal status: MET ?  ?4.  Patient will be educated and independent with HEP in order to facilitate his progress in therapy and work towards using his LUE for all daily and leisure tasks.  ?  ?Goal status:Ongoing ?  ?5.  Patient will decrease LUE fascial restrictions to Moderate amount in order to increase the functional mobility needed to complete mid level reaching tasks.  ? ? 4/18:Pt fascial restriction are moderate at this time mostly in the upper trapezius region.  ?Goal status: MET ?  ?   ?  ?LONG TERM GOALS: Target date: 02/10/2022 ?  ?Patient will increase left UE A/ROM to [facility] in order to increase ability to complete dressing tasks and functional reaching tasks with less difficulty. ?  ?Goal status: Ongoing ?  ?2.  Patient will increase LUE strength to 4+/5 in order to complete gardening activities with more shoulder stability and endurance.  ?  ?Goal status: Ongoing ?  ?3.  Patient will reports a pain level of approximately 2/10 or less when using his LUE to complete basic ADL tasks at home.  ?  ?Goal status: Ongoing ?  ?4.   Patient will decrease LUE fascial restrictions to Minimal amount in order to increase the functional mobility needed to complete high level reaching tasks.  ?  ?Goal status: Ongoing ?  ? ? ? ?   ?OT Assessment and Plan  ?Clinical Impression Statement A: Continued to focus on scapular strengthening and shoulder stability while following protocol. VC for form and  technique were provided as well as intermittent tactile cues. Most difficulty noted with shoulder abduction standing.  ?Pt will benefit from skilled therapeutic intervention in order to improve on the following performance deficits Body Structure / Function / Physical Skills  ?Body Structure / Function / Physical Skills ADL;Endurance;Muscle spasms;UE functional use;Fascial restriction;Pain;ROM;IADL;Strength;Mobility;Decreased knowledge of precautions  ?Plan P: Attempt 1# hand weight with standing shoulder flexion (to shoulder level) only. Scapular strengthening, A/ROM progression.   ?Consulted and Agree with Plan of Care Patient  ? ? ?Ailene Ravel, OTR/L,CBIS  ?[phone] ? ?01/08/2022, 10:18 AM ? ?  ? ? ? ?

## 2022-01-13 ENCOUNTER — Encounter (HOSPITAL_COMMUNITY): Payer: Federal, State, Local not specified - PPO | Admitting: Occupational Therapy

## 2022-01-15 ENCOUNTER — Encounter (HOSPITAL_COMMUNITY): Payer: Federal, State, Local not specified - PPO | Admitting: Occupational Therapy

## 2022-01-15 ENCOUNTER — Encounter (HOSPITAL_COMMUNITY): Payer: Self-pay

## 2022-01-15 ENCOUNTER — Ambulatory Visit (HOSPITAL_COMMUNITY): Payer: Federal, State, Local not specified - PPO

## 2022-01-15 DIAGNOSIS — M25612 Stiffness of left shoulder, not elsewhere classified: Secondary | ICD-10-CM

## 2022-01-15 DIAGNOSIS — R29898 Other symptoms and signs involving the musculoskeletal system: Secondary | ICD-10-CM

## 2022-01-15 DIAGNOSIS — M25512 Pain in left shoulder: Secondary | ICD-10-CM | POA: Diagnosis not present

## 2022-01-15 NOTE — Therapy (Signed)
OUTPATIENT OCCUPATIONAL THERAPY TREATMENT NOTE Mini reassessment   Patient Name: Jason Espinoza MRN: [id number] DOB:1951-01-15, 71 y.o., male Today's Date: 01/15/2022  PCP: Leslie Andrea, MD REFERRING PROVIDER:Dr. Tania Ade, MD      OT End of Session - 01/15/22 1100     Visit Number 13    Number of Visits 24    Date for OT Re-Evaluation 02/10/22    Authorization Type [employer], no copay    Authorization Time Period 25 visit limit combined PT/OT/ST; 1 used    Authorization - Visit Number 13    Authorization - Number of Visits 25    OT Start Time 1033    OT Stop Time 1111    OT Time Calculation (min) 38 min    Activity Tolerance Patient tolerated treatment well    Behavior During Therapy WFL for tasks assessed/performed                        History reviewed. No pertinent past medical history. History reviewed. No pertinent surgical history. Patient Active Problem List   Diagnosis Date Noted   OA (osteoarthritis) of shoulder 09/19/2021   Traumatic tear of supraspinatus tendon of left shoulder 08/19/2021   Full thickness tear of left subscapularis tendon 08/19/2021    ONSET DATE: 10/28/2021  REFERRING DIAG: S/P left shoulder arthroscopic RCR with subscapularis repair, SAD, biceps tenotomy  THERAPY DIAG:  Other symptoms and signs involving the musculoskeletal system  Stiffness of left shoulder, not elsewhere classified  Acute pain of left shoulder   PERTINENT HISTORY: Pt is a 71 y/o male S/P left shoulder arthroscopy with subscapularis repair, SAD, and biceps tenotomy completed on 10/28/21.  02/02/22: follow up appointment with Dr. Tamera Punt  PRECAUTIONS: shoulder. See protocol. OK to complete er now.  SUBJECTIVE: S: Ice will be my friend again today when I go home. PAIN:  Are you having pain? No Pain location: Pain description:  Aggravating factors:  Relieving factors:      OBJECTIVE: From Evaluation:   UE ROM     Supine- IR/er  adducted Passive ROM Left 11/18/2021 Left (taken supine) 12/16/2021  Left  01/15/22  Shoulder flexion 80 147 155  Shoulder abduction 85 154 180  Shoulder internal rotation 90 90 90  Shoulder external rotation Not completed due to protocol 58 70  (Blank rows = not tested)   Seated - IR/er adducted Active ROM Left 11/18/2021 Left (seated adducted shoulder) 12/16/2021 Left 01/15/22  Shoulder flexion   110 140  Shoulder abduction   63 100  Shoulder internal rotation   90 90  Shoulder external rotation   60 66  (Blank rows = not tested)       UE MMT:    Strength not assessed prior to this date due to protocol. MMT - seated IR/er adducted Left 11/18/2021 Left  01/15/22  Shoulder flexion   4-/5  Shoulder abduction   3/5  Shoulder internal rotation   5/5  Shoulder external rotation   4/5  (Blank rows = not tested)   TODAY'S TREATMENT:  01/15/22 - P/ROM: supine, all shoulder ranges, 5X   - A/ROM: supine, shoulder, flexion, horizontal abduction/adduction, 12X - A/ROM: sidelying, shoulder, er, 12X using towel roll - Strengthening: Sidelying, shoulder, er 12X using towel roll - Strengthening: Standing, shoulder flexion (to shoulder level), scaption (to shoulder level), 2#, 12X - A/ROM: standing, shoulder, abduction (to shoulder level) 10X      01/08/22   - P/ROM: supine, all shoulder ranges,  5X   - A/ROM: supine, shoulder, flexion, horizontal abduction/adduction, 12X  - Proximal shoulder strengthening in supine: 12X each, no rest breaks  - A/ROM: sidelying, shoulder, er, 12X using towel roll  - A/ROM: prone, shoulder, horizontal abduction, retraction, 12X  - A/ROM: standing lateral raises in scaption, flexion: 12X; abduction (all to shoulder level only) 10X  - Stretching: shoulder flexion; wall 2x20"   01/06/22  - P/ROM: supine, all shoulder ranges, 5X   - A/ROM: supine, shoulder, flexion, horizontal abduction/adduction, 12X - Proximal shoulder strengthening in supine: 12X each,  no rest breaks - A/ROM: sidelying, shoulder, er, 12X using towel roll - A/ROM: prone, shoulder, horizontal abduction, retraction, 10X - A/ROM: standing lateral raises in scaption, flexion, abduction (all to shoulder level only) 10X - Strengthening: yellow band, IR , er (with towel roll), 10X *No UBE used per protocol.          OT Education        Education Details     Person(s) Educated     Methods     Comprehension    HOME EXERCISE PROGRAM: Eval: table slides: no er per protocol. A/ROM wrist and elbow. 3/27: scapular A/ROM; 4/4: AA/ROM in supine; 5/2: A/ROM in supine; 5/4: red scapular theraband exercises 5/9: yellow band: IR/er, prone A/ROM retraction, horizontal abduction   SHORT TERM GOALS: Target date: 12/30/2021   Patient will increase left UE P/ROM to [facility] in order to increase ability to complete dressing tasks with less difficulty.   Goal status: MET   2.  Patient will increase LUE strength to 3/5 in order to complete reaching tasks to at least shoulder level with more shoulder stability and endurance.    Goal status: MET   3.  Patient will reports a pain level of approximately 5/10 or less when using his LUE to complete basic ADL tasks at home.    4/18: Pt reports 3/10 pain during functional reaching ADL tasks.  Goal status: MET   4.  Patient will be educated and independent with HEP in order to facilitate his progress in therapy and work towards using his LUE for all daily and leisure tasks.    Goal status:MET   5.  Patient will decrease LUE fascial restrictions to Moderate amount in order to increase the functional mobility needed to complete mid level reaching tasks.    4/18:Pt fascial restriction are moderate at this time mostly in the upper trapezius region.  Goal status: MET       LONG TERM GOALS: Target date: 02/10/2022   Patient will increase left UE A/ROM to [facility] in order to increase ability to complete dressing tasks and functional reaching tasks with  less difficulty.   Goal status: Ongoing   2.  Patient will increase LUE strength to 4+/5 in order to complete gardening activities with more shoulder stability and endurance.    Goal status: Ongoing   3.  Patient will reports a pain level of approximately 2/10 or less when using his LUE to complete basic ADL tasks at home.    Goal status: Ongoing   4.   Patient will decrease LUE fascial restrictions to Minimal amount in order to increase the functional mobility needed to complete high level reaching tasks.    Goal status: Ongoing         OT Assessment and Plan  Clinical Impression Statement A: Mini reassessment completed this date. Patient has met all STGs. He is able to demonstrate improvement with both ROM, strength, and pain  level since measurements were taken 4 weeks ago. Patient reports that he is using it more at home. He is noticing improvement with his ROM and strength. Continues to have deficits in all areas. Uses ice after sessions consistently for pain management. Able to progress to light strengthening this date while focusing on form and technique while providing verbal and tactile cueing. Pain was monitored and exercise modifications were made when appropriate.   Pt will benefit from skilled therapeutic intervention in order to improve on the following performance deficits Body Structure / Function / Physical Skills  Body Structure / Function / Physical Skills ADL;Endurance;Muscle spasms;UE functional use;Fascial restriction;Pain;ROM;IADL;Strength;Mobility;Decreased knowledge of precautions  Plan P: Continue with gradual scapular and shoulder strengthening and scapular strengthening,   Consulted and Agree with Plan of Care Patient    Ailene Ravel, OTR/L,CBIS  [phone]  01/15/2022, 11:19 AM

## 2022-01-20 ENCOUNTER — Ambulatory Visit (HOSPITAL_COMMUNITY): Payer: Federal, State, Local not specified - PPO | Admitting: Occupational Therapy

## 2022-01-20 ENCOUNTER — Encounter (HOSPITAL_COMMUNITY): Payer: Federal, State, Local not specified - PPO

## 2022-01-20 ENCOUNTER — Encounter (HOSPITAL_COMMUNITY): Payer: Self-pay | Admitting: Occupational Therapy

## 2022-01-20 DIAGNOSIS — M25512 Pain in left shoulder: Secondary | ICD-10-CM | POA: Diagnosis not present

## 2022-01-20 DIAGNOSIS — R29898 Other symptoms and signs involving the musculoskeletal system: Secondary | ICD-10-CM

## 2022-01-20 DIAGNOSIS — M25612 Stiffness of left shoulder, not elsewhere classified: Secondary | ICD-10-CM

## 2022-01-20 NOTE — Therapy (Signed)
OUTPATIENT OCCUPATIONAL THERAPY TREATMENT NOTE   Patient Name: Jason Espinoza MRN: [id number] DOB:1951-03-20, 71 y.o., male Today's Date: 01/20/2022  PCP: Macrae Wiegman Andrea, MD REFERRING PROVIDER:Dr. Tania Ade, MD      OT End of Session - 01/20/22 1341     Visit Number 14    Number of Visits 24    Date for OT Re-Evaluation 02/10/22    Authorization Type [employer], no copay    Authorization Time Period 25 visit limit combined PT/OT/ST; 1 used    Authorization - Visit Number 14    Authorization - Number of Visits 25    OT Start Time 1302    OT Stop Time 1341    OT Time Calculation (min) 39 min    Activity Tolerance Patient tolerated treatment well    Behavior During Therapy WFL for tasks assessed/performed                         History reviewed. No pertinent past medical history. History reviewed. No pertinent surgical history. Patient Active Problem List   Diagnosis Date Noted   OA (osteoarthritis) of shoulder 09/19/2021   Traumatic tear of supraspinatus tendon of left shoulder 08/19/2021   Full thickness tear of left subscapularis tendon 08/19/2021    ONSET DATE: 10/28/2021  REFERRING DIAG: S/P left shoulder arthroscopic RCR with subscapularis repair, SAD, biceps tenotomy  THERAPY DIAG:  Other symptoms and signs involving the musculoskeletal system  Stiffness of left shoulder, not elsewhere classified  Acute pain of left shoulder   PERTINENT HISTORY: Pt is a 71 y/o male S/P left shoulder arthroscopy with subscapularis repair, SAD, and biceps tenotomy completed on 10/28/21.  02/02/22: follow up appointment with Dr. Tamera Punt  PRECAUTIONS: shoulder. See protocol. OK to complete er now.  SUBJECTIVE: S:  I'm pretty much back to normal.  PAIN:  Are you having pain? No Pain location: Pain description:  Aggravating factors:  Relieving factors:      OBJECTIVE:  UE ROM     Supine- IR/er adducted Passive ROM Left 11/18/2021 Left (taken  supine) 12/16/2021  Left  01/15/22  Shoulder flexion 80 147 155  Shoulder abduction 85 154 180  Shoulder internal rotation 90 90 90  Shoulder external rotation Not completed due to protocol 58 70  (Blank rows = not tested)   Seated - IR/er adducted Active ROM Left 11/18/2021 Left (seated adducted shoulder) 12/16/2021 Left 01/15/22  Shoulder flexion   110 140  Shoulder abduction   63 100  Shoulder internal rotation   90 90  Shoulder external rotation   60 66  (Blank rows = not tested)       UE MMT:    Strength not assessed prior to this date due to protocol. MMT - seated IR/er adducted Left 11/18/2021 Left  01/15/22  Shoulder flexion   4-/5  Shoulder abduction   3/5  Shoulder internal rotation   5/5  Shoulder external rotation   4/5  (Blank rows = not tested)   TODAY'S TREATMENT:  01/20/22  -P/ROM: supine, flexion, abduction, er/IR, horizontal abduction, 5X  -Shoulder strengthening supine: 1# weight for horizontal abduction, 2# weight for protraction, flexion, er/IR, no weight  for abduction, 10X each  -proximal shoulder strengthening in supine: 1#, 4 positions, 10X each no rest breaks -Shoulder strengthening in standing: abduction with no weight, horizontal abduction and flexion with 1#, er/IR and protraction with 2# weight -Ball on wall: 1' flexion, 30" abduction -Overhead lacing: 1# wrist weight, lacing from  top down and removing from the bottom up -UBE: Level 2, 2' forward 2' reverse, pace: 8.0   01/15/22 - P/ROM: supine, all shoulder ranges, 5X   - A/ROM: supine, shoulder, flexion, horizontal abduction/adduction, 12X - A/ROM: sidelying, shoulder, er, 12X using towel roll - Strengthening: Sidelying, shoulder, er 12X using towel roll - Strengthening: Standing, shoulder flexion (to shoulder level), scaption (to shoulder level), 2#, 12X - A/ROM: standing, shoulder, abduction (to shoulder level) 10X   01/08/22   - P/ROM: supine, all shoulder ranges, 5X   - A/ROM: supine,  shoulder, flexion, horizontal abduction/adduction, 12X  - Proximal shoulder strengthening in supine: 12X each, no rest breaks  - A/ROM: sidelying, shoulder, er, 12X using towel roll  - A/ROM: prone, shoulder, horizontal abduction, retraction, 12X  - A/ROM: standing lateral raises in scaption, flexion: 12X; abduction (all to shoulder level only) 10X  - Stretching: shoulder flexion; wall 2x20"          OT Education        Education Details Discussed household objects to use for weights    Person(s) Educated patient    Methods  explanation    Comprehension Verbalized understanding   HOME EXERCISE PROGRAM: Eval: table slides: no er per protocol. A/ROM wrist and elbow. 3/27: scapular A/ROM; 4/4: AA/ROM in supine; 5/2: A/ROM in supine; 5/4: red scapular theraband exercises 5/9: yellow band: IR/er, prone A/ROM retraction, horizontal abduction   SHORT TERM GOALS: Target date: 12/30/2021   Patient will increase left UE P/ROM to [facility] in order to increase ability to complete dressing tasks with less difficulty.   Goal status: MET   2.  Patient will increase LUE strength to 3/5 in order to complete reaching tasks to at least shoulder level with more shoulder stability and endurance.    Goal status: MET   3.  Patient will reports a pain level of approximately 5/10 or less when using his LUE to complete basic ADL tasks at home.    4/18: Pt reports 3/10 pain during functional reaching ADL tasks.  Goal status: MET   4.  Patient will be educated and independent with HEP in order to facilitate his progress in therapy and work towards using his LUE for all daily and leisure tasks.    Goal status:MET   5.  Patient will decrease LUE fascial restrictions to Moderate amount in order to increase the functional mobility needed to complete mid level reaching tasks.    4/18:Pt fascial restriction are moderate at this time mostly in the upper trapezius region.  Goal status: MET       LONG TERM  GOALS: Target date: 02/10/2022   Patient will increase left UE A/ROM to [facility] in order to increase ability to complete dressing tasks and functional reaching tasks with less difficulty.   Goal status: Ongoing   2.  Patient will increase LUE strength to 4+/5 in order to complete gardening activities with more shoulder stability and endurance.    Goal status: Ongoing   3.  Patient will reports a pain level of approximately 2/10 or less when using his LUE to complete basic ADL tasks at home.    Goal status: Ongoing   4.   Patient will decrease LUE fascial restrictions to Minimal amount in order to increase the functional mobility needed to complete high level reaching tasks.    Goal status: Ongoing         OT Assessment and Plan  Clinical Impression Statement A: Pt reports he feels normal  when completing daily tasks. Session focusing on shoulder and scapular strengthening. Exercises completed in supine and standing using 1# and 2# weights, pt with max difficulty with abduction therefore no weight utilized for this motion. Added ball on wall and overhead lacing today. Pt with mod fatigue during exercises, rest breaks provided as needed. Verbal cuing for form and technique.   Pt will benefit from skilled therapeutic intervention in order to improve on the following performance deficits Body Structure / Function / Physical Skills  Body Structure / Function / Physical Skills ADL;Endurance;Muscle spasms;UE functional use;Fascial restriction;Pain;ROM;IADL;Strength;Mobility;Decreased knowledge of precautions  Plan P: Continue with gradual scapular and shoulder strengthening-attempt therapy ball strengthening exercises  Consulted and Agree with Plan of Care Patient     Guadelupe Sabin, OTR/L  [phone] 01/20/2022, 1:42 PM

## 2022-01-22 ENCOUNTER — Encounter (HOSPITAL_COMMUNITY): Payer: Self-pay

## 2022-01-22 ENCOUNTER — Encounter (HOSPITAL_COMMUNITY): Payer: Federal, State, Local not specified - PPO

## 2022-01-22 ENCOUNTER — Ambulatory Visit (HOSPITAL_COMMUNITY): Payer: Federal, State, Local not specified - PPO

## 2022-01-22 DIAGNOSIS — R29898 Other symptoms and signs involving the musculoskeletal system: Secondary | ICD-10-CM

## 2022-01-22 DIAGNOSIS — M25512 Pain in left shoulder: Secondary | ICD-10-CM

## 2022-01-22 NOTE — Therapy (Signed)
OUTPATIENT OCCUPATIONAL THERAPY TREATMENT NOTE   Patient Name: Jason Espinoza MRN: [id number] DOB:November 09, 1950, 71 y.o., male Today's Date: 01/22/2022  PCP: Leslie Andrea, MD REFERRING PROVIDER:Dr. Tania Ade, MD  Rationale for Evaluation and Treatment Rehabilitation     OT End of Session - 01/22/22 1015     Visit Number 15    Number of Visits 24    Date for OT Re-Evaluation 02/10/22    Authorization Type [employer], no copay    Authorization Time Period 25 visit limit combined PT/OT/ST; 1 used    Authorization - Visit Number 15    Authorization - Number of Visits 25    OT Start Time 0945    OT Stop Time 1023    OT Time Calculation (min) 38 min    Activity Tolerance Patient tolerated treatment well    Behavior During Therapy WFL for tasks assessed/performed                          History reviewed. No pertinent past medical history. History reviewed. No pertinent surgical history. Patient Active Problem List   Diagnosis Date Noted   OA (osteoarthritis) of shoulder 09/19/2021   Traumatic tear of supraspinatus tendon of left shoulder 08/19/2021   Full thickness tear of left subscapularis tendon 08/19/2021    ONSET DATE: 10/28/2021  REFERRING DIAG: S/P left shoulder arthroscopic RCR with subscapularis repair, SAD, biceps tenotomy  THERAPY DIAG:  Other symptoms and signs involving the musculoskeletal system  Acute pain of left shoulder   PERTINENT HISTORY: Pt is a 71 y/o male S/P left shoulder arthroscopy with subscapularis repair, SAD, and biceps tenotomy completed on 10/28/21.  02/02/22: follow up appointment with Dr. Tamera Punt  PRECAUTIONS: shoulder. See protocol. OK to complete er now.  SUBJECTIVE: S:  I'm pretty much back to normal.  PAIN:  Are you having pain? No Pain location: Pain description:  Aggravating factors:  Relieving factors:      OBJECTIVE:  UE ROM     Supine- IR/er adducted Passive ROM Left 11/18/2021 Left (taken  supine) 12/16/2021  Left  01/15/22  Shoulder flexion 80 147 155  Shoulder abduction 85 154 180  Shoulder internal rotation 90 90 90  Shoulder external rotation Not completed due to protocol 58 70  (Blank rows = not tested)   Seated - IR/er adducted Active ROM Left 11/18/2021 Left (seated adducted shoulder) 12/16/2021 Left 01/15/22  Shoulder flexion   110 140  Shoulder abduction   63 100  Shoulder internal rotation   90 90  Shoulder external rotation   60 66  (Blank rows = not tested)       UE MMT:    Strength not assessed prior to this date due to protocol. MMT - seated IR/er adducted Left 11/18/2021 Left  01/15/22  Shoulder flexion   4-/5  Shoulder abduction   3/5  Shoulder internal rotation   5/5  Shoulder external rotation   4/5  (Blank rows = not tested)   TODAY'S TREATMENT:  01/22/22  -P/ROM: supine, flexion, abduction, er/IR, horizontal abduction, 5X  -Shoulder strengthening supine: 2# weight for horizontal abduction, flexion, er/IR, 10X each  -proximal shoulder strengthening in supine: 1#, 4 positions, 10X each no rest breaks -Strengthening: shoulder, standing, protraction, flexion, abduction, 12X;abduction (to shoulder level) 5X, scaption (to shoulder level) 5X  -Overhead lacing: 1# wrist weight, lacing from top down and removing from the bottom up  - Red theraband; shoulder, IR/er, towel roll used, 12X  -  UBE: level 2, 2' forward, 2; reverse, pace: 10.0     01/20/22  -P/ROM: supine, flexion, abduction, er/IR, horizontal abduction, 5X  -Shoulder strengthening supine: 1# weight for horizontal abduction, 2# weight for protraction, flexion, er/IR, no weight  for abduction, 10X each  -proximal shoulder strengthening in supine: 1#, 4 positions, 10X each no rest breaks -Shoulder strengthening in standing: abduction with no weight, horizontal abduction and flexion with 1#, er/IR and protraction with 2# weight -Ball on wall: 1' flexion, 30" abduction -Overhead lacing: 1#  wrist weight, lacing from top down and removing from the bottom up -UBE: Level 2, 2' forward 2' reverse, pace: 8.0   01/15/22 - P/ROM: supine, all shoulder ranges, 5X   - A/ROM: supine, shoulder, flexion, horizontal abduction/adduction, 12X - A/ROM: sidelying, shoulder, er, 12X using towel roll - Strengthening: Sidelying, shoulder, er 12X using towel roll - Strengthening: Standing, shoulder flexion (to shoulder level), scaption (to shoulder level), 2#, 12X - A/ROM: standing, shoulder, abduction (to shoulder level) 10X         OT Education        Education Details Can now upgrade to red band for IR/er with HEP.    Person(s) Educated patient    Methods  explanation    Comprehension Verbalized understanding   HOME EXERCISE PROGRAM: Eval: table slides: no er per protocol. A/ROM wrist and elbow. 3/27: scapular A/ROM; 4/4: AA/ROM in supine; 5/2: A/ROM in supine; 5/4: red scapular theraband exercises 5/9: yellow band: IR/er, prone A/ROM retraction, horizontal abduction   SHORT TERM GOALS: Target date: 12/30/2021   Patient will increase left UE P/ROM to [facility] in order to increase ability to complete dressing tasks with less difficulty.   Goal status: MET   2.  Patient will increase LUE strength to 3/5 in order to complete reaching tasks to at least shoulder level with more shoulder stability and endurance.    Goal status: MET   3.  Patient will reports a pain level of approximately 5/10 or less when using his LUE to complete basic ADL tasks at home.    4/18: Pt reports 3/10 pain during functional reaching ADL tasks.  Goal status: MET   4.  Patient will be educated and independent with HEP in order to facilitate his progress in therapy and work towards using his LUE for all daily and leisure tasks.    Goal status:MET   5.  Patient will decrease LUE fascial restrictions to Moderate amount in order to increase the functional mobility needed to complete mid level reaching tasks.     4/18:Pt fascial restriction are moderate at this time mostly in the upper trapezius region.  Goal status: MET       LONG TERM GOALS: Target date: 02/10/2022   Patient will increase left UE A/ROM to [facility] in order to increase ability to complete dressing tasks and functional reaching tasks with less difficulty.   Goal status: Ongoing   2.  Patient will increase LUE strength to 4+/5 in order to complete gardening activities with more shoulder stability and endurance.    Goal status: Ongoing   3.  Patient will reports a pain level of approximately 2/10 or less when using his LUE to complete basic ADL tasks at home.    Goal status: Ongoing   4.   Patient will decrease LUE fascial restrictions to Minimal amount in order to increase the functional mobility needed to complete high level reaching tasks.    Goal status: Ongoing  OT Assessment and Plan  Clinical Impression Statement A: Session focusing on shoulder and scapular strengthening. Pt with mod fatigue during exercises, rest breaks provided as needed. Verbal cuing for form and technique. Prompts to not hold his breath and breathe through movement.   Pt will benefit from skilled therapeutic intervention in order to improve on the following performance deficits Body Structure / Function / Physical Skills  Body Structure / Function / Physical Skills ADL;Endurance;Muscle spasms;UE functional use;Fascial restriction;Pain;ROM;IADL;Strength;Mobility;Decreased knowledge of precautions  Plan P: Measure for MD appointment on 6/5. Plan to complete reassessment and discharge.   Consulted and Agree with Plan of Care Patient     Ailene Ravel, OTR/L,CBIS  Supplemental OT - [place] and [facility]  01/22/2022, 10:16 AM

## 2022-01-27 ENCOUNTER — Encounter (HOSPITAL_COMMUNITY): Payer: Self-pay | Admitting: Occupational Therapy

## 2022-01-27 ENCOUNTER — Ambulatory Visit (HOSPITAL_COMMUNITY): Payer: Federal, State, Local not specified - PPO | Attending: Family Medicine | Admitting: Occupational Therapy

## 2022-01-27 ENCOUNTER — Encounter (HOSPITAL_COMMUNITY): Payer: Federal, State, Local not specified - PPO

## 2022-01-27 DIAGNOSIS — Z9889 Other specified postprocedural states: Secondary | ICD-10-CM | POA: Diagnosis not present

## 2022-01-27 DIAGNOSIS — R29898 Other symptoms and signs involving the musculoskeletal system: Secondary | ICD-10-CM

## 2022-01-27 DIAGNOSIS — M25612 Stiffness of left shoulder, not elsewhere classified: Secondary | ICD-10-CM

## 2022-01-27 DIAGNOSIS — Y939 Activity, unspecified: Secondary | ICD-10-CM | POA: Diagnosis not present

## 2022-01-27 DIAGNOSIS — M25512 Pain in left shoulder: Secondary | ICD-10-CM

## 2022-01-27 DIAGNOSIS — S46812D Strain of other muscles, fascia and tendons at shoulder and upper arm level, left arm, subsequent encounter: Secondary | ICD-10-CM | POA: Insufficient documentation

## 2022-01-27 DIAGNOSIS — M19012 Primary osteoarthritis, left shoulder: Secondary | ICD-10-CM | POA: Diagnosis not present

## 2022-01-27 NOTE — Therapy (Signed)
OUTPATIENT OCCUPATIONAL THERAPY REASSESSMENT, TREATMENT NOTE DISCHARGE SUMMARY    Patient Name: Jason Espinoza MRN: [id number] DOB:[birth year], 71 y.o., male Today's Date: 01/27/2022  PCP: Pola Furno Andrea, MD REFERRING PROVIDER:Dr. Tania Ade, MD  Rationale for Evaluation and Treatment Rehabilitation     OT End of Session - 01/27/22 1047     Visit Number 16    Number of Visits 24    Date for OT Re-Evaluation 02/10/22    Authorization Type [employer], no copay    Authorization Time Period 25 visit limit combined PT/OT/ST; 1 used    Authorization - Visit Number 16    Authorization - Number of Visits 25    OT Start Time [phone]    OT Stop Time 1012    OT Time Calculation (min) 26 min    Activity Tolerance Patient tolerated treatment well    Behavior During Therapy [facility] for tasks assessed/performed                           History reviewed. No pertinent past medical history. History reviewed. No pertinent surgical history. Patient Active Problem List   Diagnosis Date Noted   OA (osteoarthritis) of shoulder 09/19/2021   Traumatic tear of supraspinatus tendon of left shoulder 08/19/2021   Full thickness tear of left subscapularis tendon 08/19/2021    ONSET DATE: 10/28/2021  REFERRING DIAG: S/P left shoulder arthroscopic RCR with subscapularis repair, SAD, biceps tenotomy  THERAPY DIAG:  Other symptoms and signs involving the musculoskeletal system  Acute pain of left shoulder  Stiffness of left shoulder, not elsewhere classified   Rationale for Evaluation and Treatment Rehabilitation    PERTINENT HISTORY: Pt is a 71 y/o male S/P left shoulder arthroscopy with subscapularis repair, SAD, and biceps tenotomy completed on 10/28/21.  02/02/22: follow up appointment with Dr. Tamera Punt  PRECAUTIONS: shoulder. See protocol. OK to complete er now.  SUBJECTIVE: S:  I'm pretty much back to normal.  PAIN:  Are you having pain? No Pain location: Pain  description:  Aggravating factors:  Relieving factors:      OBJECTIVE:  UE ROM     Supine- IR/er adducted Passive ROM Left 11/18/2021 Left (taken supine) 12/16/2021  Left  01/15/22 Left 01/27/22  Shoulder flexion 80 147 155 155  Shoulder abduction 85 154 180 180  Shoulder internal rotation 90 90 90 90  Shoulder external rotation Not completed due to protocol 58 70 70  (Blank rows = not tested)   Seated - IR/er adducted Active ROM Left 11/18/2021 Left (seated adducted shoulder) 12/16/2021 Left 01/15/22 Left 01/27/22  Shoulder flexion   110 140 146  Shoulder abduction   63 100 128  Shoulder internal rotation   90 90 90  Shoulder external rotation   60 66 66  (Blank rows = not tested)       UE MMT:    Strength not assessed prior to this date due to protocol. MMT - seated IR/er adducted Left 11/18/2021 Left  01/15/22 Left 01/27/22  Shoulder flexion   4-/5 4/5  Shoulder abduction   3/5 4-/5  Shoulder internal rotation   5/5 5/5  Shoulder external rotation   4/5 4/5  (Blank rows = not tested)   TODAY'S TREATMENT:  01/27/22  -P/ROM: supine, flexion, abduction, er/IR, horizontal abduction, 5X  -Red theraband strengthening: protraction, flexion, horizontal abduction, er/IR, abduction, 10X each  01/22/22  -P/ROM: supine, flexion, abduction, er/IR, horizontal abduction, 5X  -Shoulder strengthening supine: 2# weight for horizontal  abduction, flexion, er/IR, 10X each  -proximal shoulder strengthening in supine: 1#, 4 positions, 10X each no rest breaks -Strengthening: shoulder, standing, protraction, flexion, abduction, 12X;abduction (to shoulder level) 5X, scaption (to shoulder level) 5X  -Overhead lacing: 1# wrist weight, lacing from top down and removing from the bottom up  - Red theraband; shoulder, IR/er, towel roll used, 12X  - UBE: level 2, 2' forward, 2; reverse, pace: 10.0  01/20/22  -P/ROM: supine, flexion, abduction, er/IR, horizontal abduction, 5X  -Shoulder  strengthening supine: 1# weight for horizontal abduction, 2# weight for protraction, flexion, er/IR, no weight  for abduction, 10X each  -proximal shoulder strengthening in supine: 1#, 4 positions, 10X each no rest breaks -Shoulder strengthening in standing: abduction with no weight, horizontal abduction and flexion with 1#, er/IR and protraction with 2# weight -Ball on wall: 1' flexion, 30" abduction -Overhead lacing: 1# wrist weight, lacing from top down and removing from the bottom up -UBE: Level 2, 2' forward 2' reverse, pace: 8.0      OT Education        Web designer) Educated patient    Methods  explanation    Comprehension Verbalized understanding   HOME EXERCISE PROGRAM: Eval: table slides: no er per protocol. A/ROM wrist and elbow. 3/27: scapular A/ROM; 4/4: AA/ROM in supine; 5/2: A/ROM in supine; 5/4: red scapular theraband exercises 5/9: yellow band: IR/er, prone A/ROM retraction, horizontal abduction   SHORT TERM GOALS: Target date: 12/30/2021   Patient will increase left UE P/ROM to [facility] in order to increase ability to complete dressing tasks with less difficulty.   Goal status: MET   2.  Patient will increase LUE strength to 3/5 in order to complete reaching tasks to at least shoulder level with more shoulder stability and endurance.    Goal status: MET   3.  Patient will reports a pain level of approximately 5/10 or less when using his LUE to complete basic ADL tasks at home.    4/18: Pt reports 3/10 pain during functional reaching ADL tasks.  Goal status: MET   4.  Patient will be educated and independent with HEP in order to facilitate his progress in therapy and work towards using his LUE for all daily and leisure tasks.    Goal status:MET   5.  Patient will decrease LUE fascial restrictions to Moderate amount in order to increase the functional mobility needed to complete mid level reaching tasks.    4/18:Pt fascial restriction are moderate  at this time mostly in the upper trapezius region.  Goal status: MET       LONG TERM GOALS: Target date: 02/10/2022   Patient will increase left UE A/ROM to [facility] in order to increase ability to complete dressing tasks and functional reaching tasks with less difficulty.   Goal status: Met   2.  Patient will increase LUE strength to 4+/5 in order to complete gardening activities with more shoulder stability and endurance.    Goal status: Partially Met   3.  Patient will reports a pain level of approximately 2/10 or less when using his LUE to complete basic ADL tasks at home.    Goal status: Met  4.   Patient will decrease LUE fascial restrictions to Minimal amount in order to increase the functional mobility needed to complete high level reaching tasks.    Goal status: Met         OT Assessment and Plan  Clinical Impression Statement A:  Reassessment completed this session, pt has met all STGs, and 3/4 LTGs with remaining LTG partially met. Pt reports he is using his LUE daily, has been able to lift and move boards. Reports only has pain with overuse, abduction continues to be the most difficult movement. Pt demonstrates ROM WFL, strength and activity tolerance are improving. Updated HEP for red theraband strengthening, pt completing during session with good form. Pt is agreeable to discharge today with HEP for continued strengthening at home.   Pt will benefit from skilled therapeutic intervention in order to improve on the following performance deficits Body Structure / Function / Physical Skills  Body Structure / Function / Physical Skills ADL;Endurance;Muscle spasms;UE functional use;Fascial restriction;Pain;ROM;IADL;Strength;Mobility;Decreased knowledge of precautions  Plan P: Discharge pt  Consulted and Agree with Plan of Care Patient      Guadelupe Sabin, OTR/L  [phone] 01/27/2022, 10:48 AM     OCCUPATIONAL THERAPY DISCHARGE SUMMARY  Visits from Start of Care:  16  Current functional level related to goals / functional outcomes: See above. Pt has met all STGs, 3/4 LTGs with remaining LTG partially met. Pt is using LUE during ADLs, no pain unless has overused.    Remaining deficits: Continued strength deficits, primarily with abduction   Education / Equipment: HEP for red theraband strengthening   Patient agrees to discharge. Patient goals were met. Patient is being discharged due to meeting the stated rehab goals.Marland Kitchen

## 2022-01-27 NOTE — Patient Instructions (Signed)

## 2022-01-29 ENCOUNTER — Encounter (HOSPITAL_COMMUNITY): Payer: Federal, State, Local not specified - PPO

## 2022-02-03 ENCOUNTER — Encounter (HOSPITAL_COMMUNITY): Payer: Federal, State, Local not specified - PPO | Admitting: Occupational Therapy

## 2022-02-03 ENCOUNTER — Encounter (HOSPITAL_COMMUNITY): Payer: Federal, State, Local not specified - PPO

## 2022-02-05 ENCOUNTER — Encounter (HOSPITAL_COMMUNITY): Payer: Federal, State, Local not specified - PPO | Admitting: Occupational Therapy

## 2022-02-10 ENCOUNTER — Encounter (HOSPITAL_COMMUNITY): Payer: Federal, State, Local not specified - PPO | Admitting: Occupational Therapy

## 2022-02-12 ENCOUNTER — Encounter (HOSPITAL_COMMUNITY): Payer: Federal, State, Local not specified - PPO

## 2022-02-17 ENCOUNTER — Encounter (HOSPITAL_COMMUNITY): Payer: Federal, State, Local not specified - PPO | Admitting: Occupational Therapy

## 2022-02-19 ENCOUNTER — Encounter (HOSPITAL_COMMUNITY): Payer: Federal, State, Local not specified - PPO | Admitting: Occupational Therapy

## 2022-02-24 ENCOUNTER — Encounter (HOSPITAL_COMMUNITY): Payer: Federal, State, Local not specified - PPO | Admitting: Occupational Therapy

## 2022-02-26 ENCOUNTER — Encounter (HOSPITAL_COMMUNITY): Payer: Federal, State, Local not specified - PPO

## 2022-04-01 ENCOUNTER — Encounter (INDEPENDENT_AMBULATORY_CARE_PROVIDER_SITE_OTHER): Payer: Self-pay | Admitting: *Deleted

## 2022-12-14 ENCOUNTER — Encounter: Payer: Self-pay | Admitting: *Deleted

## 2023-01-19 IMAGING — CR DG SHOULDER 2+V*L*
3 series · 3 of 3 positions shown · non-contrast
Comparison: None.

CLINICAL DATA: Shoulder pain and bruising status post fall. Limited
range of motion.

EXAM:
LEFT SHOULDER - 2+ VIEW

[w shoulder grashey left]
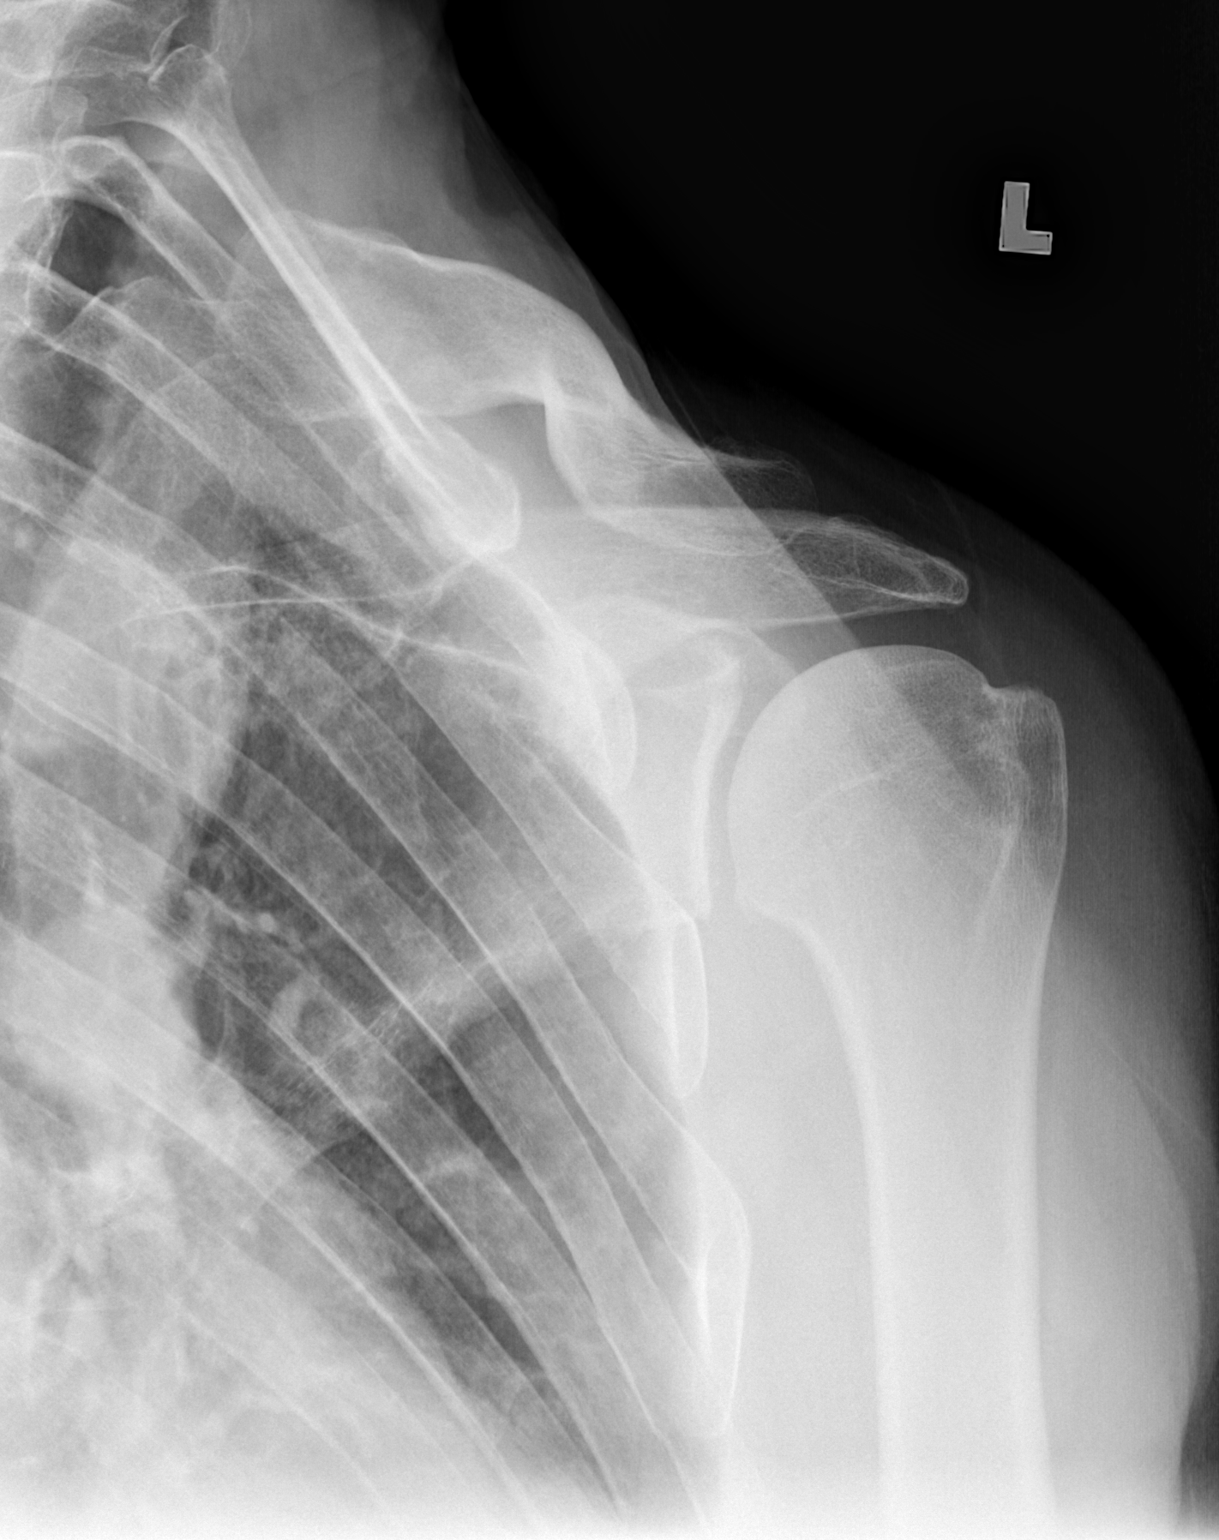

[w shoulder y view left]
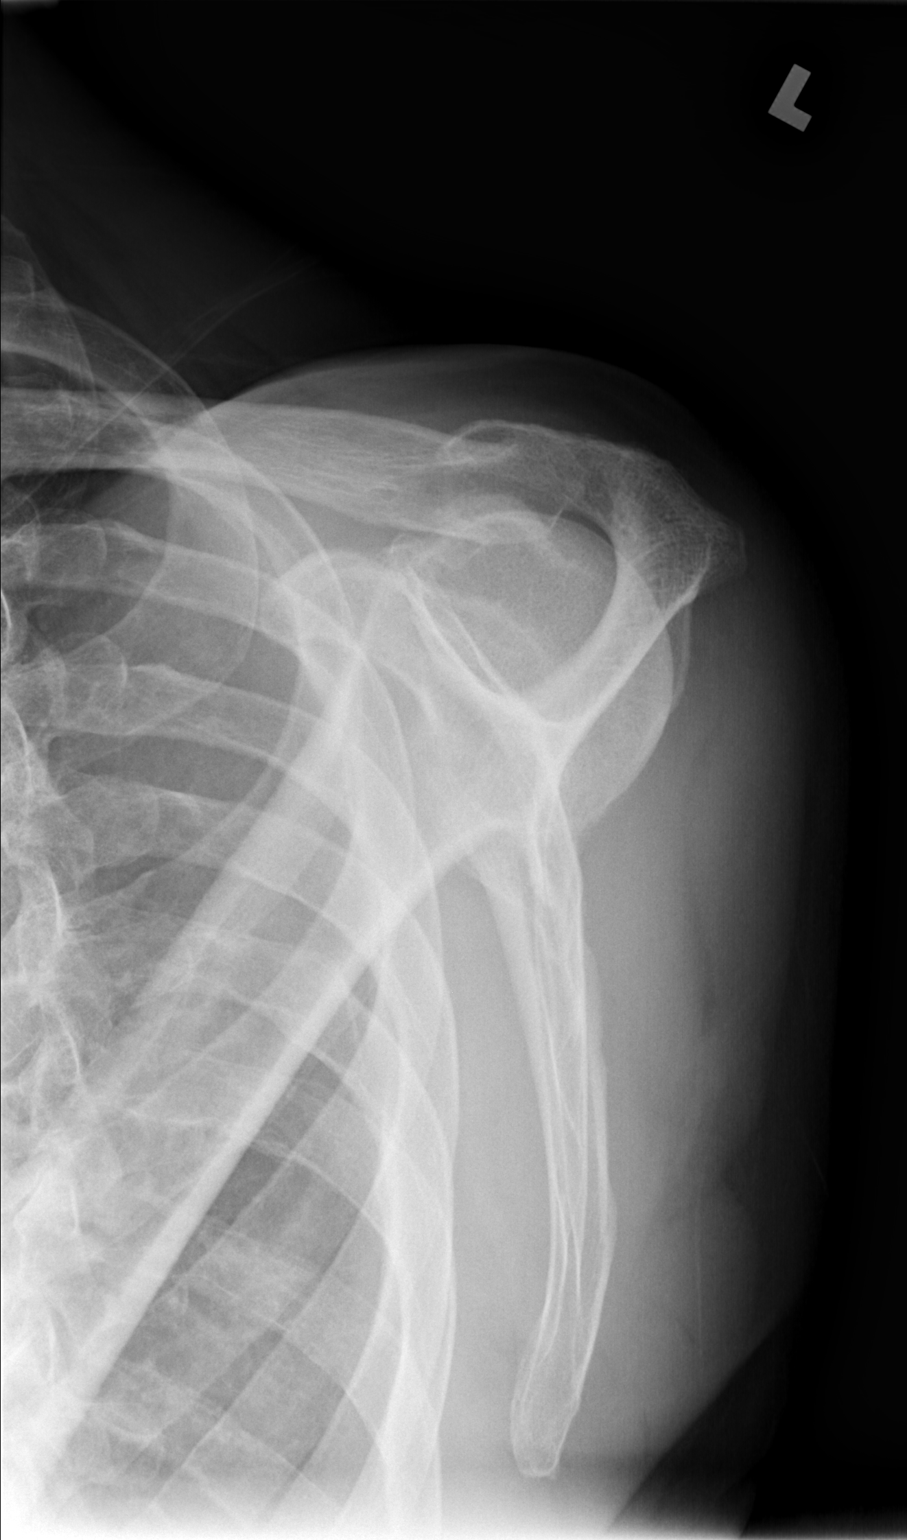

[x shoulder axillary left]
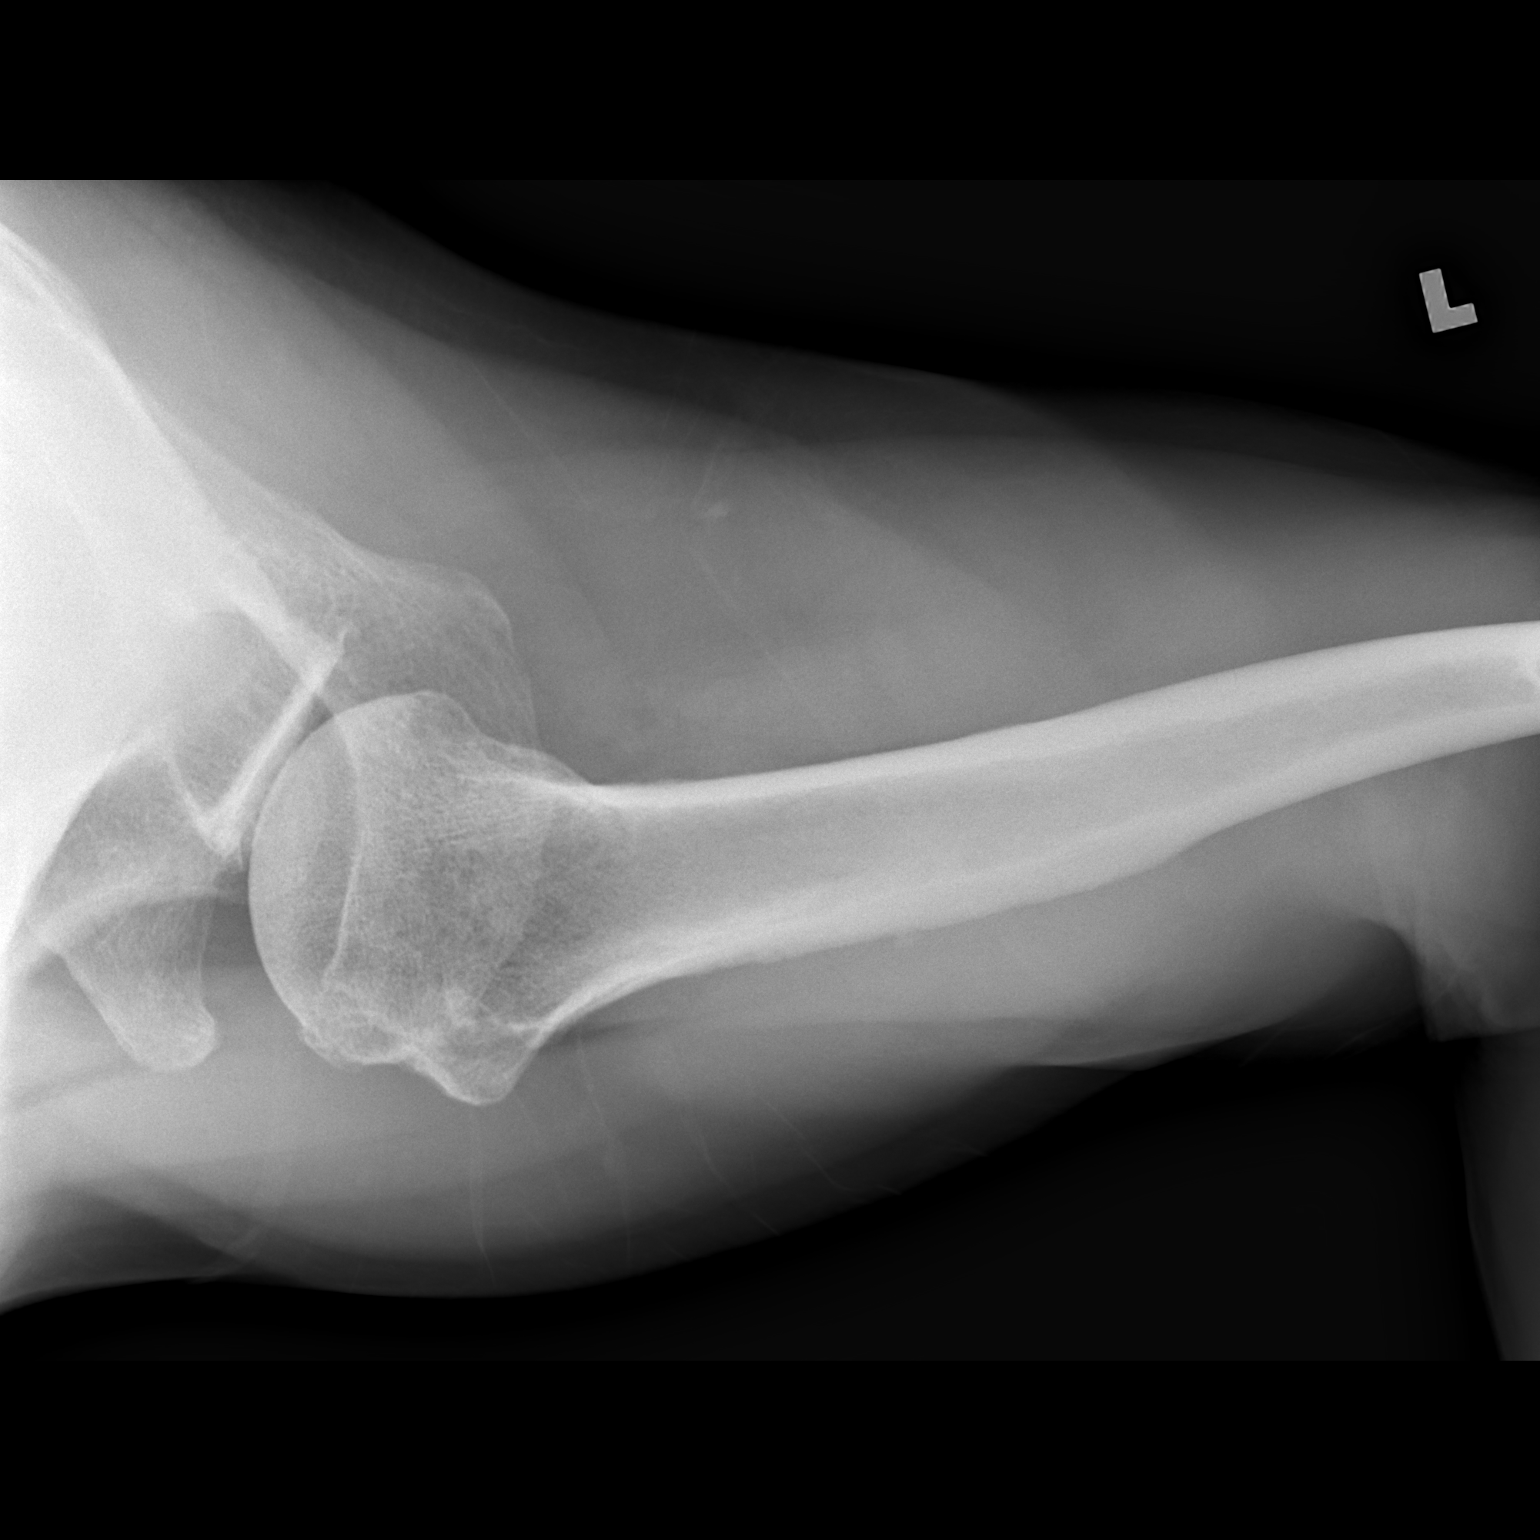

[3 of 3 positions shown; findings below may reference images not displayed]

FINDINGS: The mineralization and alignment are normal. There is no evidence of
acute fracture or dislocation. There are mild acromioclavicular and
glenohumeral degenerative changes. The subacromial space is
preserved.
IMPRESSION: Mild degenerative changes.  No acute osseous findings.

## 2024-09-11 ENCOUNTER — Other Ambulatory Visit: Payer: Self-pay | Admitting: *Deleted

## 2024-09-11 DIAGNOSIS — K429 Umbilical hernia without obstruction or gangrene: Secondary | ICD-10-CM

## 2024-09-13 ENCOUNTER — Ambulatory Visit: Admitting: General Surgery

## 2024-09-13 ENCOUNTER — Encounter: Payer: Self-pay | Admitting: General Surgery

## 2024-09-13 VITALS — BP 131/79 | HR 57 | Temp 98.0°F | Resp 12 | Ht 72.0 in | Wt 189.0 lb

## 2024-09-13 DIAGNOSIS — K429 Umbilical hernia without obstruction or gangrene: Secondary | ICD-10-CM | POA: Insufficient documentation

## 2024-09-13 NOTE — Progress Notes (Signed)
 [facility] History and Physical  Reason for Referral: Umbilical hernia  Referring Physician: Marvine Rush, MD   Chief Complaint   New Patient (Initial Visit)     Jason Espinoza is a 74 y.o. male.  HPI:   Discussed the use of AI scribe software for clinical note transcription with the patient, who gave verbal consent to proceed.  History of Present Illness Jason Espinoza is a 74 year old male who presents for evaluation of a bulge superior to the umbilicus.  Approximately two months ago, he noted the onset of a bulge located just above and slightly lateral to the umbilicus. The bulge was initially painful following minor trauma but subsequently became asymptomatic. He did not attempt manual reduction, and the bulge did not change in size or position with changes in posture.  He denies changes in bowel habits, constipation, diarrhea, hematochezia, nausea, vomiting, fevers, or chills.  Currently, the bulge is nearly resolved and only appreciable on palpation. The bulge was previously described as marble-sized and is now minimal in size.  He does not take prescription medications or anticoagulants, using only multivitamins and natural supplements for inflammation.     History reviewed. No pertinent past medical history.  History reviewed. No pertinent surgical history.  Family History  Problem Relation Age of Onset   Healthy Mother    Diabetes Father    Hypertension Father     Social History[1]  Medications: I have reviewed the patient's current medications. Allergies as of 09/13/2024   No Known Allergies      Medication List        Accurate as of September 13, 2024 12:42 PM. If you have any questions, ask your nurse or doctor.          STOP taking these medications    meloxicam  7.5 MG tablet Commonly known as: MOBIC  Stopped by: Manuelita Pander, MD   nitroGLYCERIN  0.2 mg/hr patch Commonly known as: NITRODUR - Dosed in mg/24 hr Stopped  by: Manuelita Pander, MD   tiZANidine  4 MG tablet Commonly known as: ZANAFLEX  Stopped by: Manuelita Pander, MD       TAKE these medications    MULTIPLE VITAMIN PO Take by mouth.         ROS:  A comprehensive review of systems was negative except for: Gastrointestinal: positive for hernia Musculoskeletal: positive for joint pain  Blood pressure 131/79, pulse (!) 57, temperature 98 F (36.7 C), temperature source Oral, resp. rate 12, height 6' (1.829 m), weight 189 lb (85.7 kg), SpO2 94%.  Physical Exam GENERAL: Alert, cooperative, well developed, no acute distress. HEENT: Normocephalic, normal oropharynx, moist mucous membranes. CHEST: Clear to auscultation bilaterally, no wheezes, rhonchi, or crackles. CARDIOVASCULAR: Normal heart rate and rhythm ABDOMEN: Soft, undistended, small bulge superior edge of umbilicus, <1cm defect, small in size, reducible  EXTREMITIES: No cyanosis or edema. NEUROLOGICAL: Cranial nerves grossly intact, moves all extremities without gross motor or sensory deficit.  Results: None   Assessment & Plan Umbilical hernia Small umbilical hernia (<1 cm) with preperitoneal fat, minimally symptomatic, no bowel involvement, low risk of incarceration or strangulation. Surgical repair not necessary if not symptomatic due to minimal symptoms and size. - Provided education on natural history and signs/symptoms of complications including incarceration and strangulation. - Recommended watchful waiting. - Advised monitoring for changes such as increased size, pain, or irreducibility, and to contact office if these occur. - Discussed surgical repair option if symptomatic or enlarges, including small incision and permanent sutures; mesh for larger  defects. - Reviewed repair risks: bleeding, infection, recurrence (low for this size). - Advised seeking emergency care if hernia becomes irreducible, hard, or severely painful.     All questions were answered to the  satisfaction of the patient.    Manuelita JAYSON Pander 09/13/2024, 12:42 PM          [1]  Social History Tobacco Use   Smoking status: Never   Smokeless tobacco: Never  Substance Use Topics   Alcohol use: Not Currently   Drug use: Never

## 2024-09-13 NOTE — Patient Instructions (Signed)
 Call with any changes or issues.

## 2024-09-14 ENCOUNTER — Ambulatory Visit: Admitting: General Surgery
